# Patient Record
Sex: Female | Born: 1945 | Race: Black or African American | Hispanic: No | Marital: Married | Smoking: Current every day smoker
Health system: Southern US, Community
[De-identification: ages and names within clinical notes are randomized; demographics above are authoritative.]

## PROBLEM LIST (undated history)

## (undated) HISTORY — PX: ABDOMINAL HYSTERECTOMY: SHX81

---

## 2003-08-11 ENCOUNTER — Ambulatory Visit (HOSPITAL_COMMUNITY): Admission: RE | Admit: 2003-08-11 | Discharge: 2003-08-12 | Payer: Self-pay | Admitting: Neurosurgery

## 2005-06-19 ENCOUNTER — Ambulatory Visit: Payer: Self-pay | Admitting: General Practice

## 2013-02-16 ENCOUNTER — Ambulatory Visit: Payer: Self-pay | Admitting: Family Medicine

## 2013-04-02 ENCOUNTER — Ambulatory Visit: Payer: Self-pay | Admitting: Family Medicine

## 2016-04-03 ENCOUNTER — Other Ambulatory Visit: Payer: Self-pay | Admitting: Family Medicine

## 2016-04-27 ENCOUNTER — Other Ambulatory Visit: Payer: Self-pay | Admitting: Family Medicine

## 2016-04-27 DIAGNOSIS — Z1239 Encounter for other screening for malignant neoplasm of breast: Secondary | ICD-10-CM

## 2016-07-12 ENCOUNTER — Ambulatory Visit
Admission: RE | Admit: 2016-07-12 | Discharge: 2016-07-12 | Disposition: A | Discharge status: Home / Self Care | Payer: Medicare Other | Source: Ambulatory Visit | Attending: Family Medicine | Admitting: Family Medicine

## 2016-07-12 ENCOUNTER — Encounter: Payer: Self-pay | Admitting: Radiology

## 2016-07-12 DIAGNOSIS — Z1239 Encounter for other screening for malignant neoplasm of breast: Secondary | ICD-10-CM

## 2016-07-12 DIAGNOSIS — Z1231 Encounter for screening mammogram for malignant neoplasm of breast: Secondary | ICD-10-CM | POA: Insufficient documentation

## 2017-10-23 ENCOUNTER — Other Ambulatory Visit: Payer: Self-pay | Admitting: Physician Assistant

## 2017-10-23 DIAGNOSIS — Z1239 Encounter for other screening for malignant neoplasm of breast: Secondary | ICD-10-CM

## 2017-11-06 ENCOUNTER — Ambulatory Visit
Admission: RE | Admit: 2017-11-06 | Discharge: 2017-11-06 | Disposition: A | Discharge status: Home / Self Care | Payer: Medicare Other | Source: Ambulatory Visit | Attending: Physician Assistant | Admitting: Physician Assistant

## 2017-11-06 DIAGNOSIS — Z1231 Encounter for screening mammogram for malignant neoplasm of breast: Secondary | ICD-10-CM | POA: Diagnosis present

## 2017-11-06 DIAGNOSIS — Z1239 Encounter for other screening for malignant neoplasm of breast: Secondary | ICD-10-CM

## 2017-11-18 ENCOUNTER — Telehealth: Payer: Self-pay | Admitting: *Deleted

## 2017-11-18 DIAGNOSIS — Z87891 Personal history of nicotine dependence: Secondary | ICD-10-CM

## 2017-11-18 DIAGNOSIS — Z122 Encounter for screening for malignant neoplasm of respiratory organs: Secondary | ICD-10-CM

## 2017-11-18 NOTE — Telephone Encounter (Signed)
Received referral for initial lung cancer screening scan. Contacted patient and obtained smoking history,(current, 30.75 pack year) as well as answering questions related to screening process. Patient denies signs of lung cancer such as weight loss or hemoptysis. Patient denies comorbidity that would prevent curative treatment if lung cancer were found. Patient is scheduled for shared decision making visit and CT scan on 12/05/17.

## 2017-12-04 ENCOUNTER — Encounter: Payer: Self-pay | Admitting: Nurse Practitioner

## 2017-12-05 ENCOUNTER — Ambulatory Visit
Admission: RE | Admit: 2017-12-05 | Discharge: 2017-12-05 | Disposition: A | Discharge status: Home / Self Care | Payer: Medicare Other | Source: Ambulatory Visit | Attending: Nurse Practitioner | Admitting: Nurse Practitioner

## 2017-12-05 ENCOUNTER — Inpatient Hospital Stay: Payer: Medicare Other | Attending: Nurse Practitioner | Admitting: Nurse Practitioner

## 2017-12-05 DIAGNOSIS — Z122 Encounter for screening for malignant neoplasm of respiratory organs: Secondary | ICD-10-CM

## 2017-12-05 DIAGNOSIS — J432 Centrilobular emphysema: Secondary | ICD-10-CM | POA: Diagnosis not present

## 2017-12-05 DIAGNOSIS — I700 Atherosclerosis of aorta: Secondary | ICD-10-CM | POA: Insufficient documentation

## 2017-12-05 DIAGNOSIS — Z87891 Personal history of nicotine dependence: Secondary | ICD-10-CM | POA: Diagnosis not present

## 2017-12-05 DIAGNOSIS — N200 Calculus of kidney: Secondary | ICD-10-CM | POA: Diagnosis not present

## 2017-12-05 DIAGNOSIS — I517 Cardiomegaly: Secondary | ICD-10-CM | POA: Diagnosis not present

## 2017-12-05 DIAGNOSIS — R911 Solitary pulmonary nodule: Secondary | ICD-10-CM | POA: Insufficient documentation

## 2017-12-05 NOTE — Progress Notes (Signed)
In accordance with CMS guidelines, patient has met eligibility criteria including age, absence of signs or symptoms of lung cancer.  Social History   Tobacco Use  . Smoking status: Current Every Day Smoker    Packs/day: 0.75    Years: 41.00    Pack years: 30.75    Types: Cigarettes  Substance Use Topics  . Alcohol use: Not on file  . Drug use: Not on file      A shared decision-making session was conducted prior to the performance of CT scan. This includes one or more decision aids, includes benefits and harms of screening, follow-up diagnostic testing, over-diagnosis, false positive rate, and total radiation exposure.   Counseling on the importance of adherence to annual lung cancer LDCT screening, impact of co-morbidities, and ability or willingness to undergo diagnosis and treatment is imperative for compliance of the program.   Counseling on the importance of continued smoking cessation for former smokers; the importance of smoking cessation for current smokers, and information about tobacco cessation interventions have been given to patient including [place] and [facility] quit [place] programs.   Written order for lung cancer screening with LDCT has been given to the patient and any and all questions have been answered to the best of my abilities.    Yearly follow up will be coordinated by Burgess Estelle, Thoracic Navigator.  Beckey Rutter, DNP, AGNP-C [place] [facility] [phone] (work cell) [phone] (office) 12/05/17 4:04 PM

## 2017-12-06 ENCOUNTER — Telehealth: Payer: Self-pay | Admitting: *Deleted

## 2017-12-06 NOTE — Telephone Encounter (Signed)
Notified patient of LDCT lung cancer screening program results with recommendation for 3 month follow up imaging. Also notified of incidental findings noted below and is encouraged to discuss further with PCP who will receive a copy of this note and/or the CT report. Patient verbalizes understanding.   IMPRESSION: 1. Lung-RADS 4A, suspicious. Dominant part solid 11.4 mm posterior left upper lobe pulmonary nodule. Follow up low-dose chest CT without contrast in 3 months (please use the following order, "CT CHEST LCS NODULE FOLLOW-UP W/O CM") is recommended. 2. Mild cardiomegaly. 3. Nonobstructing upper left nephrolithiasis.  Aortic Atherosclerosis (ICD10-I70.0) and Emphysema ([id number]

## 2018-02-19 ENCOUNTER — Other Ambulatory Visit: Payer: Self-pay | Admitting: Family Medicine

## 2018-02-19 DIAGNOSIS — R911 Solitary pulmonary nodule: Secondary | ICD-10-CM

## 2018-02-19 DIAGNOSIS — F17200 Nicotine dependence, unspecified, uncomplicated: Secondary | ICD-10-CM

## 2018-03-03 ENCOUNTER — Telehealth: Payer: Self-pay | Admitting: *Deleted

## 2018-03-03 NOTE — Telephone Encounter (Signed)
Attempted to contact patient r/t CT Screening due at this time.  No answer and unable to leave message at this time.      

## 2018-03-04 ENCOUNTER — Telehealth: Payer: Self-pay | Admitting: *Deleted

## 2018-03-04 ENCOUNTER — Encounter: Payer: Self-pay | Admitting: *Deleted

## 2018-03-04 NOTE — Telephone Encounter (Signed)
Attempted to contact patient r/t CT Screening due at this time.  No answer and unable to leave message at this time.      

## 2018-03-11 ENCOUNTER — Telehealth: Payer: Self-pay | Admitting: *Deleted

## 2018-03-11 DIAGNOSIS — Z87891 Personal history of nicotine dependence: Secondary | ICD-10-CM

## 2018-03-11 DIAGNOSIS — R918 Other nonspecific abnormal finding of lung field: Secondary | ICD-10-CM

## 2018-03-11 DIAGNOSIS — Z122 Encounter for screening for malignant neoplasm of respiratory organs: Secondary | ICD-10-CM

## 2018-03-11 NOTE — Telephone Encounter (Signed)
Patient returned messages regarding follow up lung CT scan. She is agreeable for scheduling.

## 2018-03-14 ENCOUNTER — Ambulatory Visit
Admission: RE | Admit: 2018-03-14 | Discharge: 2018-03-14 | Disposition: A | Discharge status: Home / Self Care | Payer: Medicare Other | Source: Ambulatory Visit | Attending: Nurse Practitioner | Admitting: Nurse Practitioner

## 2018-03-14 ENCOUNTER — Telehealth: Payer: Self-pay | Admitting: *Deleted

## 2018-03-14 ENCOUNTER — Encounter: Payer: Self-pay | Admitting: *Deleted

## 2018-03-14 DIAGNOSIS — N200 Calculus of kidney: Secondary | ICD-10-CM | POA: Diagnosis not present

## 2018-03-14 DIAGNOSIS — I700 Atherosclerosis of aorta: Secondary | ICD-10-CM | POA: Diagnosis not present

## 2018-03-14 DIAGNOSIS — R918 Other nonspecific abnormal finding of lung field: Secondary | ICD-10-CM | POA: Diagnosis not present

## 2018-03-14 DIAGNOSIS — J432 Centrilobular emphysema: Secondary | ICD-10-CM | POA: Insufficient documentation

## 2018-03-14 DIAGNOSIS — I2510 Atherosclerotic heart disease of native coronary artery without angina pectoris: Secondary | ICD-10-CM | POA: Diagnosis not present

## 2018-03-14 DIAGNOSIS — F17210 Nicotine dependence, cigarettes, uncomplicated: Secondary | ICD-10-CM | POA: Diagnosis not present

## 2018-03-14 DIAGNOSIS — Z122 Encounter for screening for malignant neoplasm of respiratory organs: Secondary | ICD-10-CM | POA: Insufficient documentation

## 2018-03-14 DIAGNOSIS — Z87891 Personal history of nicotine dependence: Secondary | ICD-10-CM

## 2018-03-14 NOTE — Telephone Encounter (Signed)
Notified patient of LDCT lung cancer screening program results with recommendation for 12 month follow up imaging.  Also notified of incidental findings noted below and is encouraged to discuss further questions with PCP who will receive a copy of this not and/or the CT reports.  Patient verbalized understanding.    IMPRESSION: 1. Lung-RADS 2, benign appearance or behavior. Continue annual screening with low-dose chest CT without contrast in 12 months. Similar appearance of bilateral pulmonary nodules, including the mixed attenuation left upper lobe nodule. 2. Underlying pattern of upper lobe predominant ground-glass nodularity likely represents respiratory bronchiolitis. 3. Aortic atherosclerosis (ICD10-I70.0), coronary artery atherosclerosis and emphysema (ICD10-J43.9). 4. Left nephrolithiasis.  Patient informed about the findings of left nephrolitiasis and informed if she was having problems with this to call her PCP with any issues or questions.  Voiced understanding.

## 2018-03-18 ENCOUNTER — Encounter: Payer: Self-pay | Admitting: *Deleted

## 2018-03-18 ENCOUNTER — Telehealth: Payer: Self-pay | Admitting: *Deleted

## 2018-03-18 NOTE — Telephone Encounter (Signed)
Called and discussed results with patient.  Including results of bronchiolitis and left nephrolithiasis.  Encouraged pt if she was having any symptoms or questions to notify PCP and that we would follow up in 1 year with the LDCT scan, voiced understanding.    IMPRESSION: 1. Lung-RADS 2, benign appearance or behavior. Continue annual screening with low-dose chest CT without contrast in 12 months. Similar appearance of bilateral pulmonary nodules, including the mixed attenuation left upper lobe nodule. 2. Underlying pattern of upper lobe predominant ground-glass nodularity likely represents respiratory bronchiolitis. 3. Aortic atherosclerosis (ICD10-I70.0), coronary artery atherosclerosis and emphysema (ICD10-J43.9). 4. Left nephrolithiasis.

## 2018-08-13 IMAGING — CT CT CHEST LUNG CANCER SCREENING LOW DOSE W/O CM
2 of 5 series · 15 of 40 positions shown, 18 images · non-contrast
Comparison: None.

CLINICAL DATA: 72-year-old asymptomatic female current smoker with
30.75 pack-year smoking history.

EXAM:
CT CHEST WITHOUT CONTRAST LOW-DOSE FOR LUNG CANCER SCREENING
TECHNIQUE: Multidetector CT imaging of the chest was performed following the
standard protocol without IV contrast.

[Series 3: lung · axial · 0.56mm/px · z∈[-1131,-856]mm · 12 of 307 slices shown, 15 images (1 of 2)]
[im 16/307  mediastinal]
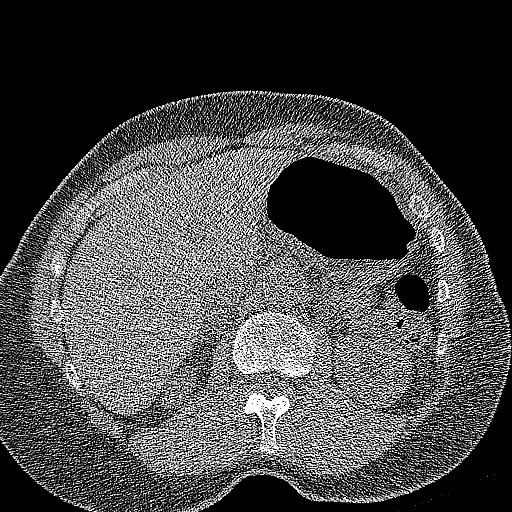
[im 16/307  lung]
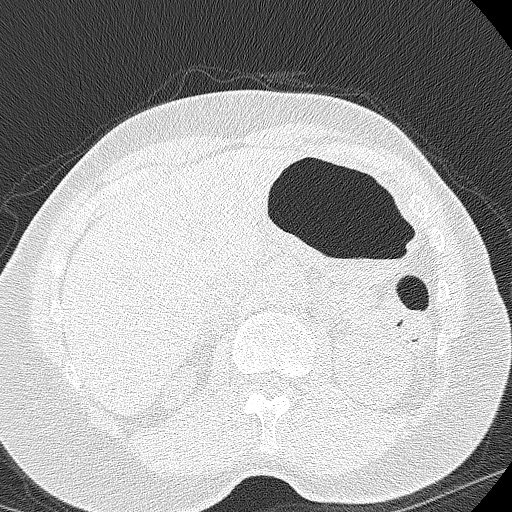
[im 46/307  lung]
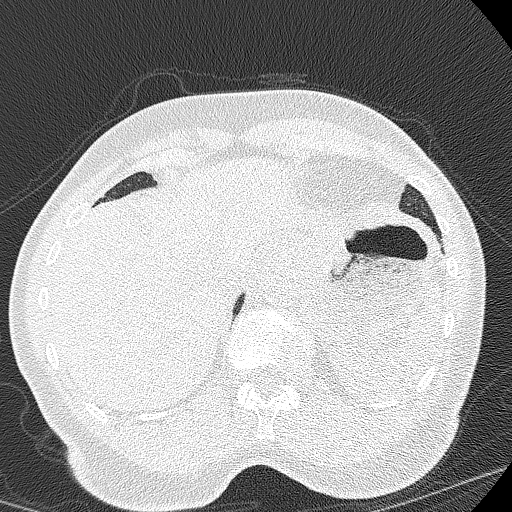
[im 62/307  lung]
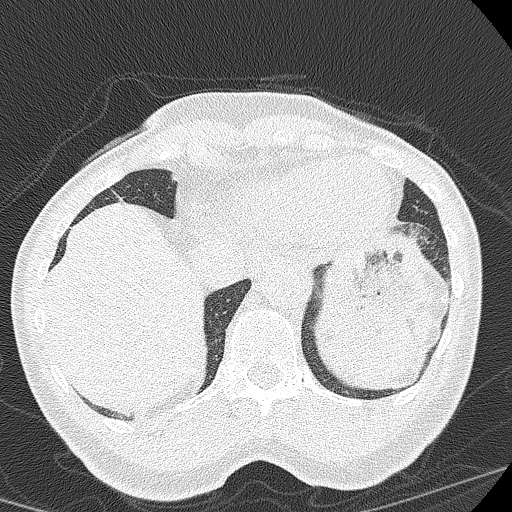
[im 92/307  lung]
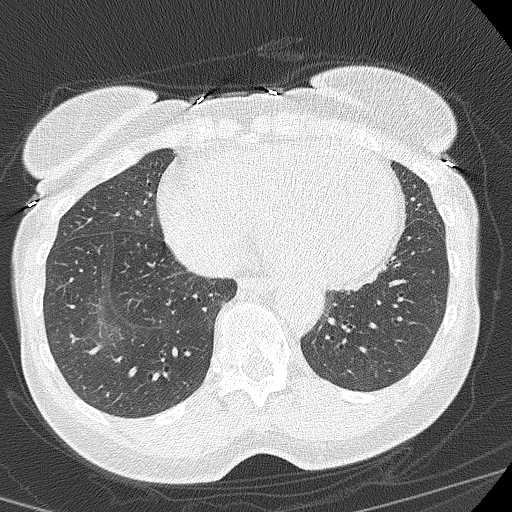
[im 123/307  mediastinal]
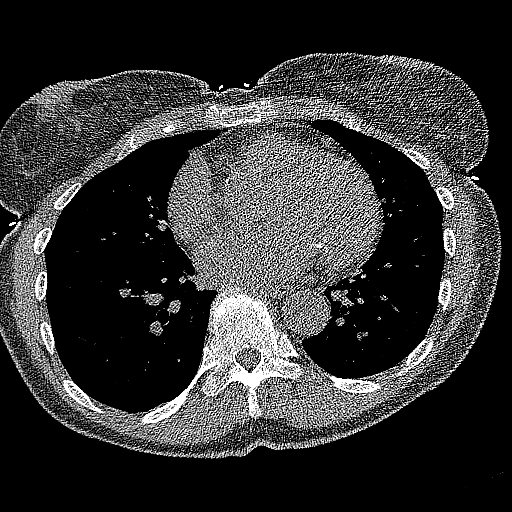
[im 123/307  lung]
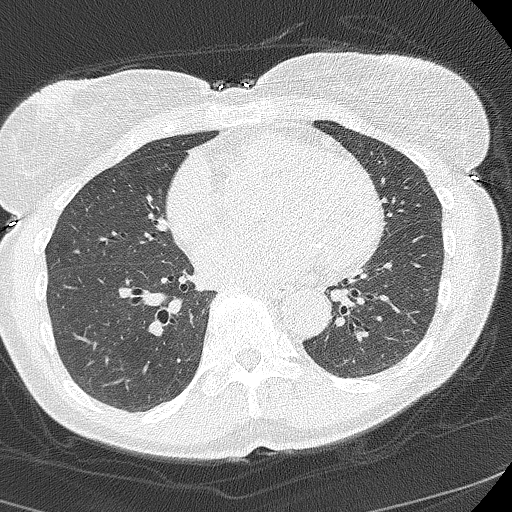
[im 138/307  lung]
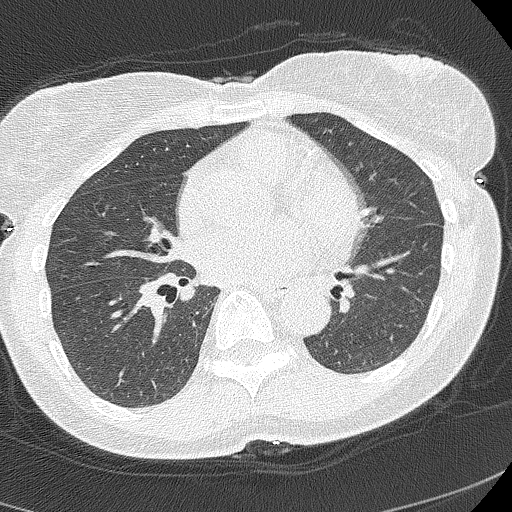
[im 169/307  lung]
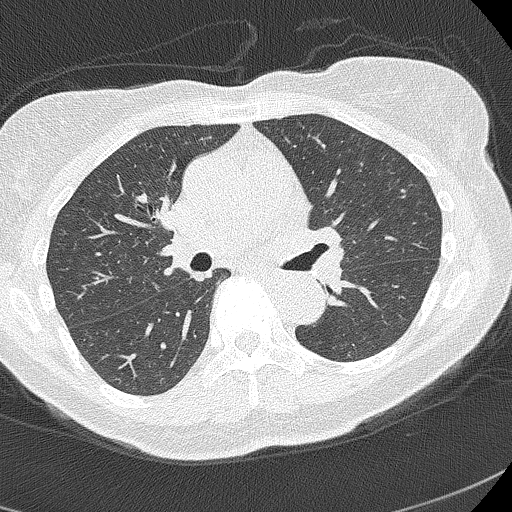
[im 184/307  lung]
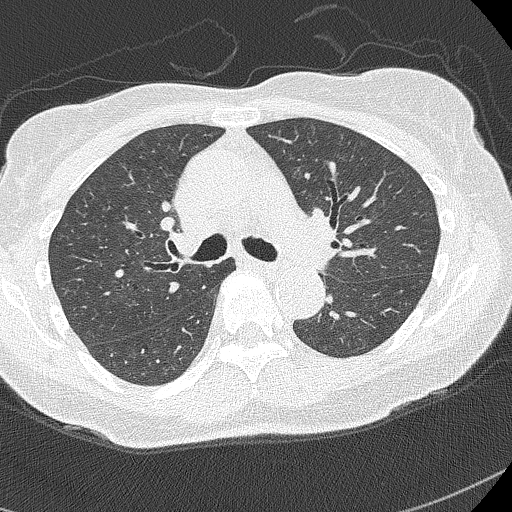
[im 215/307  mediastinal]
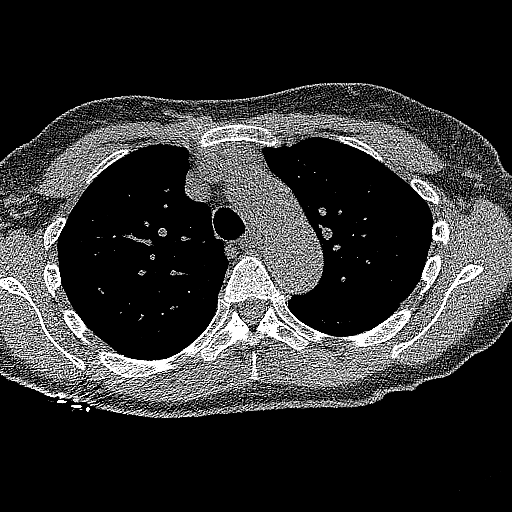
[im 215/307  lung]
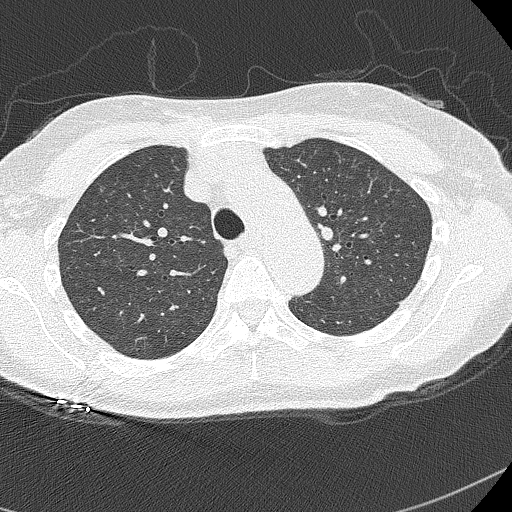
[im 245/307  lung]
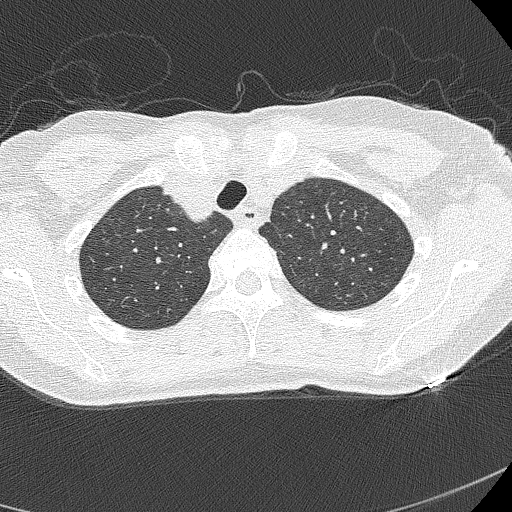
[im 261/307  lung]
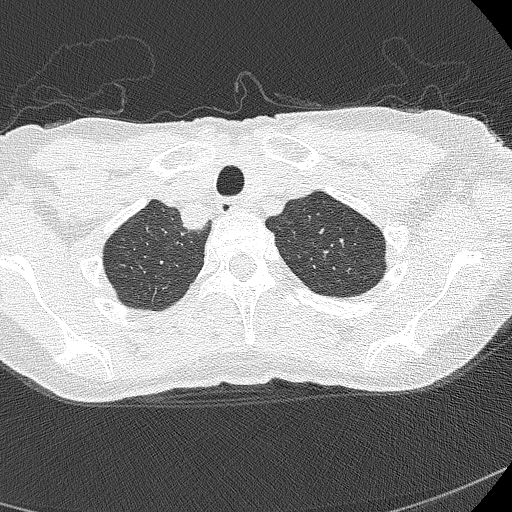
[im 291/307  lung]
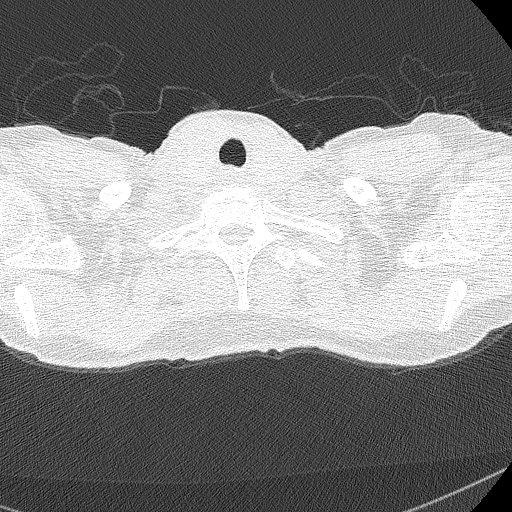

[Series 4: lung · coronal · 0.56mm/px · 3 of 230 slices shown (2 of 2)]
[im 46/230  lung]
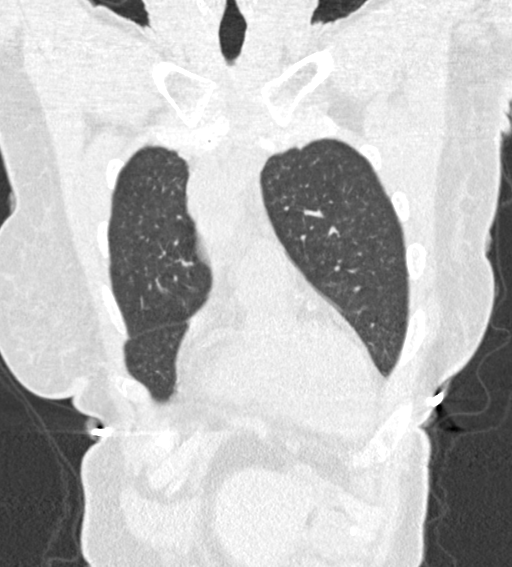
[im 92/230  lung]
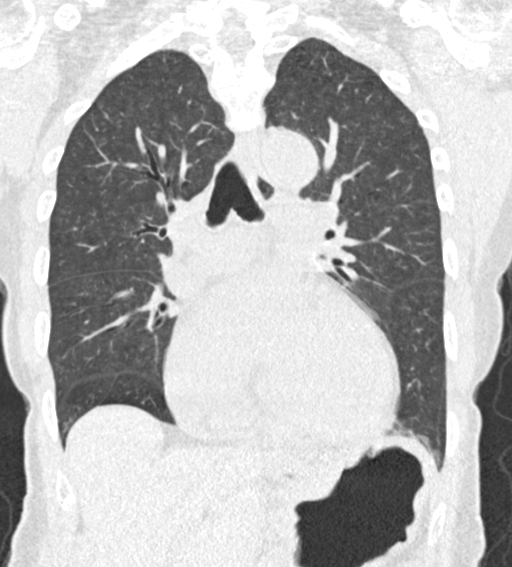
[im 138/230  lung]
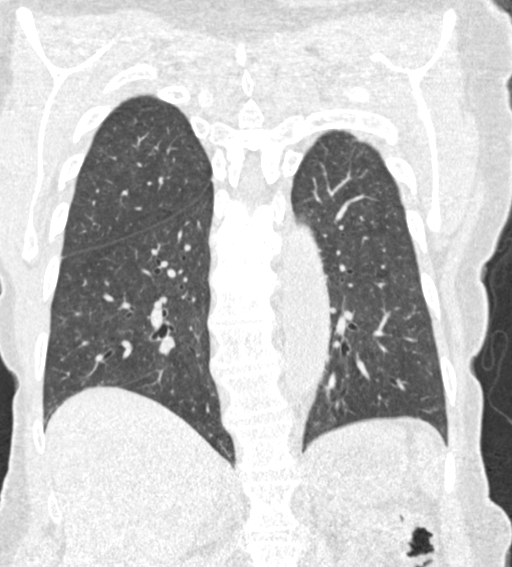

[15 of 40 positions shown; findings below may reference images not displayed]

FINDINGS: Cardiovascular: Mild cardiomegaly. No significant pericardial
effusion/thickening. Atherosclerotic nonaneurysmal thoracic aorta.
Normal caliber pulmonary arteries.

Mediastinum/Nodes: No discrete thyroid nodules. Unremarkable
esophagus. No pathologically enlarged axillary, mediastinal or hilar
lymph nodes, noting limited sensitivity for the detection of hilar
adenopathy on this noncontrast study.

Lungs/Pleura: No pneumothorax. No pleural effusion. Mild
centrilobular emphysema. No acute consolidative airspace disease or
lung masses. There are several pulmonary nodules scattered
throughout both lungs, with a dominant part solid posterior left
upper lobe pulmonary nodule measuring 11.4 mm in total volume
derived mean diameter with a 6.0 mm solid component (series 3/image
102).

Upper abdomen: Nonobstructing 2 mm upper left renal stone.

Musculoskeletal: No aggressive appearing focal osseous lesions.
Marked thoracic spondylosis.
IMPRESSION: 1. Lung-RADS 4A, suspicious. Dominant part solid 11.4 mm posterior
left upper lobe pulmonary nodule. Follow up low-dose chest CT
without contrast in 3 months (please use the following order, "CT
CHEST LCS NODULE FOLLOW-UP W/O CM") is recommended.
2. Mild cardiomegaly.
3. Nonobstructing upper left nephrolithiasis.

Aortic Atherosclerosis (9D5XA-CT7.7) and Emphysema (9D5XA-K7D.8).

These results will be called to the ordering clinician or
representative by the Radiologist Assistant, and communication
documented in the PACS or zVision Dashboard.

## 2018-09-02 ENCOUNTER — Ambulatory Visit
Admission: RE | Admit: 2018-09-02 | Discharge: 2018-09-02 | Disposition: A | Discharge status: Home / Self Care | Payer: Medicare Other | Attending: Family Medicine | Admitting: Family Medicine

## 2018-09-02 ENCOUNTER — Ambulatory Visit
Admission: RE | Admit: 2018-09-02 | Discharge: 2018-09-02 | Disposition: A | Discharge status: Home / Self Care | Payer: Medicare Other | Source: Ambulatory Visit | Attending: Family Medicine | Admitting: Family Medicine

## 2018-09-02 ENCOUNTER — Other Ambulatory Visit: Payer: Self-pay | Admitting: Family Medicine

## 2018-09-02 DIAGNOSIS — M25551 Pain in right hip: Secondary | ICD-10-CM

## 2019-04-02 ENCOUNTER — Telehealth: Payer: Self-pay | Admitting: *Deleted

## 2019-04-02 DIAGNOSIS — Z122 Encounter for screening for malignant neoplasm of respiratory organs: Secondary | ICD-10-CM

## 2019-04-02 DIAGNOSIS — Z87891 Personal history of nicotine dependence: Secondary | ICD-10-CM

## 2019-04-02 NOTE — Telephone Encounter (Signed)
Patient has been notified that annual lung cancer screening low dose CT scan is due currently or will be in near future. Confirmed that patient is within the age range of 55-77, and asymptomatic, (no signs or symptoms of lung cancer). Patient denies illness that would prevent curative treatment for lung cancer if found. Verified smoking history, (current, 31.5 pack year). The shared decision making visit was done 12/05/17. Patient is agreeable for CT scan being scheduled.

## 2019-04-07 ENCOUNTER — Other Ambulatory Visit: Payer: Self-pay

## 2019-04-07 ENCOUNTER — Ambulatory Visit
Admission: RE | Admit: 2019-04-07 | Discharge: 2019-04-07 | Disposition: A | Discharge status: Home / Self Care | Payer: Medicare Other | Source: Ambulatory Visit | Attending: Oncology | Admitting: Oncology

## 2019-04-07 DIAGNOSIS — Z87891 Personal history of nicotine dependence: Secondary | ICD-10-CM | POA: Insufficient documentation

## 2019-04-07 DIAGNOSIS — Z122 Encounter for screening for malignant neoplasm of respiratory organs: Secondary | ICD-10-CM

## 2019-04-09 ENCOUNTER — Encounter: Payer: Self-pay | Admitting: *Deleted

## 2019-05-11 IMAGING — CR DG HIP (WITH OR WITHOUT PELVIS) 2-3V*R*
2 series · 2 of 2 positions shown · non-contrast
Comparison: None.

CLINICAL DATA: Right hip pain for the past 6 months.  No injury.

EXAM:
DG HIP (WITH OR WITHOUT PELVIS) 2-3V RIGHT

[hip ap]
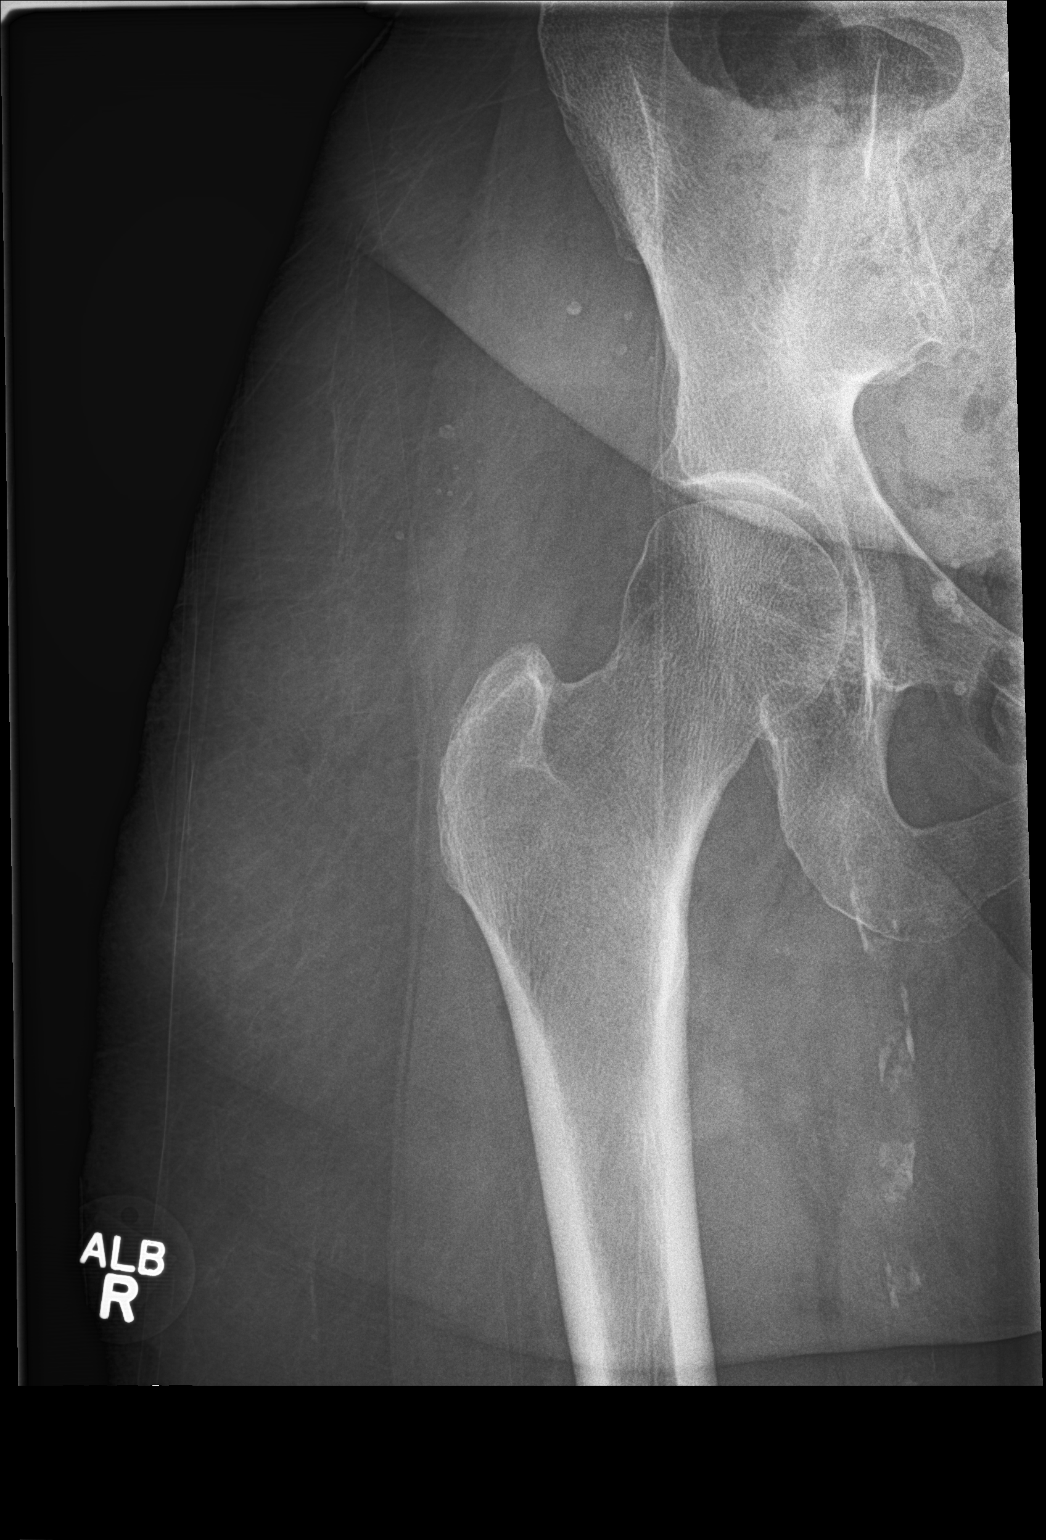

[hip lat]
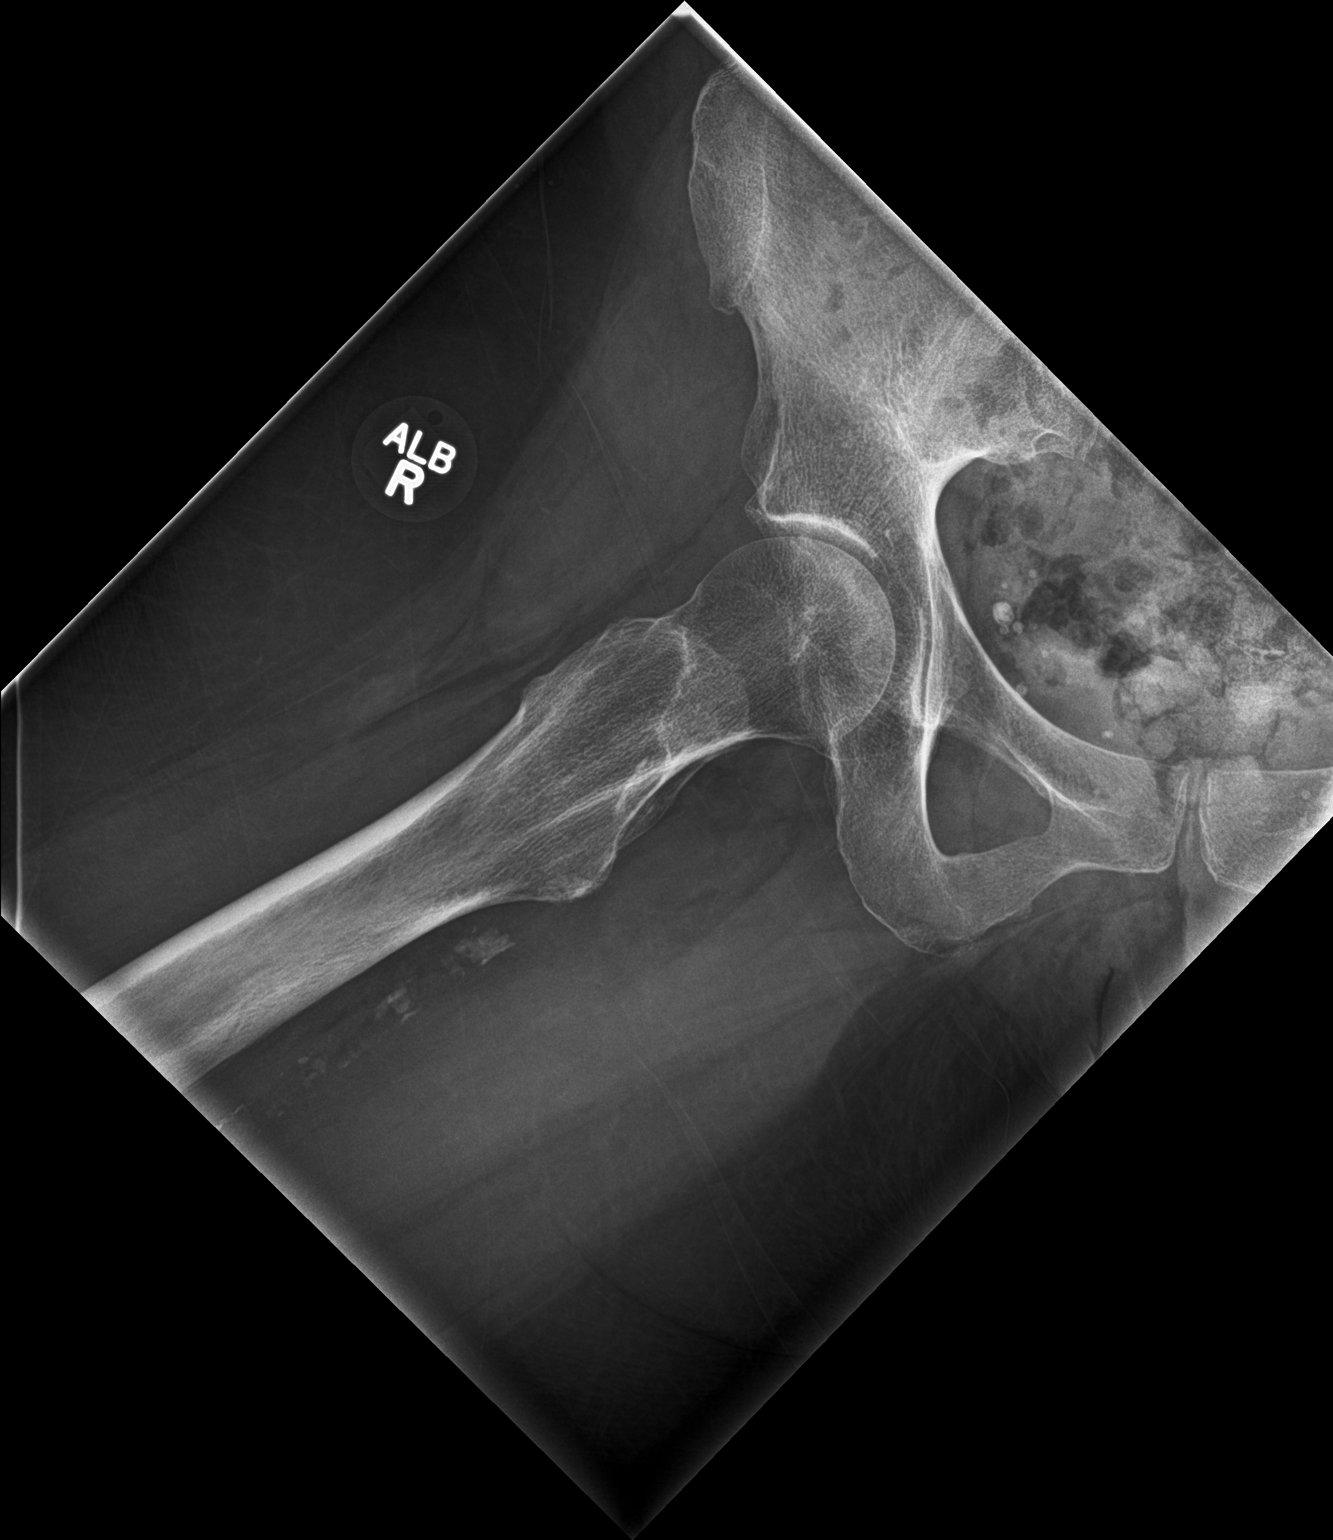

[2 of 2 positions shown; findings below may reference images not displayed]

FINDINGS: There is no evidence of hip fracture or dislocation. There is no
evidence of arthropathy or other focal bone abnormality. Osteopenia.
Vascular calcifications.
IMPRESSION: Negative.

## 2019-09-10 ENCOUNTER — Other Ambulatory Visit: Payer: Self-pay | Admitting: Family Medicine

## 2019-09-10 DIAGNOSIS — Z1231 Encounter for screening mammogram for malignant neoplasm of breast: Secondary | ICD-10-CM

## 2019-09-10 DIAGNOSIS — M8580 Other specified disorders of bone density and structure, unspecified site: Secondary | ICD-10-CM

## 2019-11-23 ENCOUNTER — Ambulatory Visit
Admission: RE | Admit: 2019-11-23 | Discharge: 2019-11-23 | Disposition: A | Discharge status: Home / Self Care | Payer: Medicare Other | Source: Ambulatory Visit | Attending: Family Medicine | Admitting: Family Medicine

## 2019-11-23 ENCOUNTER — Other Ambulatory Visit: Payer: Self-pay

## 2019-11-23 DIAGNOSIS — Z1231 Encounter for screening mammogram for malignant neoplasm of breast: Secondary | ICD-10-CM | POA: Diagnosis not present

## 2019-11-23 DIAGNOSIS — M8580 Other specified disorders of bone density and structure, unspecified site: Secondary | ICD-10-CM

## 2019-11-23 DIAGNOSIS — M810 Age-related osteoporosis without current pathological fracture: Secondary | ICD-10-CM | POA: Insufficient documentation

## 2020-03-09 ENCOUNTER — Telehealth: Payer: Self-pay | Admitting: *Deleted

## 2020-03-09 NOTE — Telephone Encounter (Signed)
(  03/09/2020) Unable to leave message for pt to notify them that it is time to schedule annual low dose lung cancer screening CT scan. Will call back to verify information prior to the scan being scheduled SRW     

## 2020-03-15 ENCOUNTER — Telehealth: Payer: Self-pay | Admitting: *Deleted

## 2020-03-15 NOTE — Telephone Encounter (Signed)
(  03/15/2020) Unable to leave message for pt to notify them that it is time to schedule annual low dose lung cancer screening CT scan. Will call back to verify information prior to the scan being scheduled SRW     

## 2020-03-26 ENCOUNTER — Telehealth: Payer: Self-pay | Admitting: *Deleted

## 2020-03-26 NOTE — Telephone Encounter (Signed)
Attempted to leave a voicemail to inform the patient that her lung cancer screening scan is due soon. Unable to leave a voicemail because patient does not have it set up. Will continue to try to reach her.

## 2020-04-12 ENCOUNTER — Encounter: Payer: Self-pay | Admitting: *Deleted

## 2020-04-22 ENCOUNTER — Encounter: Payer: Self-pay | Admitting: *Deleted

## 2020-05-30 ENCOUNTER — Other Ambulatory Visit: Payer: Self-pay | Admitting: Family Medicine

## 2020-05-30 ENCOUNTER — Other Ambulatory Visit (HOSPITAL_COMMUNITY): Payer: Self-pay | Admitting: Family Medicine

## 2020-06-07 ENCOUNTER — Other Ambulatory Visit: Payer: Self-pay | Admitting: Family Medicine

## 2020-06-07 DIAGNOSIS — F17200 Nicotine dependence, unspecified, uncomplicated: Secondary | ICD-10-CM

## 2020-06-07 DIAGNOSIS — R911 Solitary pulmonary nodule: Secondary | ICD-10-CM

## 2020-06-16 ENCOUNTER — Ambulatory Visit
Admission: RE | Admit: 2020-06-16 | Discharge: 2020-06-16 | Disposition: A | Discharge status: Home / Self Care | Payer: Medicare Other | Source: Ambulatory Visit | Attending: Nurse Practitioner | Admitting: Nurse Practitioner

## 2020-06-16 ENCOUNTER — Ambulatory Visit
Admission: RE | Admit: 2020-06-16 | Discharge: 2020-06-16 | Disposition: A | Discharge status: Home / Self Care | Payer: Medicare Other | Source: Ambulatory Visit | Attending: Family Medicine | Admitting: Family Medicine

## 2020-06-16 ENCOUNTER — Other Ambulatory Visit: Payer: Self-pay

## 2020-06-16 ENCOUNTER — Other Ambulatory Visit: Payer: Self-pay | Admitting: *Deleted

## 2020-06-16 DIAGNOSIS — Z87891 Personal history of nicotine dependence: Secondary | ICD-10-CM | POA: Diagnosis not present

## 2020-06-16 DIAGNOSIS — Z122 Encounter for screening for malignant neoplasm of respiratory organs: Secondary | ICD-10-CM

## 2020-06-16 DIAGNOSIS — R911 Solitary pulmonary nodule: Secondary | ICD-10-CM | POA: Diagnosis present

## 2020-06-16 DIAGNOSIS — F17200 Nicotine dependence, unspecified, uncomplicated: Secondary | ICD-10-CM | POA: Insufficient documentation

## 2020-06-16 NOTE — Progress Notes (Signed)
Patient arrived for lung screening scan. Okay per Trula Ore in Business office, patient is a current smoker, with a 32.25 pack year history.

## 2020-06-21 ENCOUNTER — Encounter: Payer: Self-pay | Admitting: *Deleted

## 2020-11-02 ENCOUNTER — Other Ambulatory Visit: Payer: Self-pay | Admitting: Family Medicine

## 2020-11-02 DIAGNOSIS — Z1231 Encounter for screening mammogram for malignant neoplasm of breast: Secondary | ICD-10-CM

## 2020-11-23 ENCOUNTER — Other Ambulatory Visit: Payer: Self-pay

## 2020-11-23 ENCOUNTER — Ambulatory Visit
Admission: RE | Admit: 2020-11-23 | Discharge: 2020-11-23 | Disposition: A | Discharge status: Home / Self Care | Payer: Medicare Other | Source: Ambulatory Visit | Attending: Family Medicine | Admitting: Family Medicine

## 2020-11-23 DIAGNOSIS — Z1231 Encounter for screening mammogram for malignant neoplasm of breast: Secondary | ICD-10-CM | POA: Insufficient documentation

## 2021-09-20 ENCOUNTER — Ambulatory Visit
Admission: RE | Admit: 2021-09-20 | Discharge: 2021-09-20 | Disposition: A | Discharge status: Home / Self Care | Payer: Medicare Other | Source: Ambulatory Visit | Attending: Family Medicine | Admitting: Family Medicine

## 2021-09-20 ENCOUNTER — Ambulatory Visit
Admission: RE | Admit: 2021-09-20 | Discharge: 2021-09-20 | Disposition: A | Discharge status: Home / Self Care | Payer: Medicare Other | Attending: Family Medicine | Admitting: Family Medicine

## 2021-09-20 ENCOUNTER — Other Ambulatory Visit: Payer: Self-pay | Admitting: Family Medicine

## 2021-09-20 DIAGNOSIS — M545 Low back pain, unspecified: Secondary | ICD-10-CM

## 2021-10-06 ENCOUNTER — Telehealth: Payer: Self-pay | Admitting: *Deleted

## 2021-10-06 NOTE — Telephone Encounter (Signed)
Left message for pt to call to schedule f/u lung screening CT scan.  °

## 2021-10-17 ENCOUNTER — Other Ambulatory Visit: Payer: Self-pay

## 2021-10-17 DIAGNOSIS — Z87891 Personal history of nicotine dependence: Secondary | ICD-10-CM

## 2021-10-17 DIAGNOSIS — F17210 Nicotine dependence, cigarettes, uncomplicated: Secondary | ICD-10-CM

## 2021-10-25 ENCOUNTER — Other Ambulatory Visit: Payer: Self-pay | Admitting: Family Medicine

## 2021-10-25 DIAGNOSIS — Z1231 Encounter for screening mammogram for malignant neoplasm of breast: Secondary | ICD-10-CM

## 2021-10-27 ENCOUNTER — Ambulatory Visit
Admission: RE | Admit: 2021-10-27 | Discharge: 2021-10-27 | Disposition: A | Discharge status: Home / Self Care | Payer: Medicare Other | Source: Ambulatory Visit | Attending: Family Medicine | Admitting: Family Medicine

## 2021-10-27 DIAGNOSIS — F17210 Nicotine dependence, cigarettes, uncomplicated: Secondary | ICD-10-CM | POA: Insufficient documentation

## 2021-10-27 DIAGNOSIS — Z87891 Personal history of nicotine dependence: Secondary | ICD-10-CM | POA: Diagnosis present

## 2021-10-30 ENCOUNTER — Other Ambulatory Visit: Payer: Self-pay | Admitting: Acute Care

## 2021-10-30 DIAGNOSIS — Z87891 Personal history of nicotine dependence: Secondary | ICD-10-CM

## 2021-10-30 DIAGNOSIS — F17210 Nicotine dependence, cigarettes, uncomplicated: Secondary | ICD-10-CM

## 2021-11-22 ENCOUNTER — Other Ambulatory Visit: Payer: Self-pay | Admitting: Family Medicine

## 2021-11-22 DIAGNOSIS — M5459 Other low back pain: Secondary | ICD-10-CM

## 2021-12-06 ENCOUNTER — Ambulatory Visit
Admission: RE | Admit: 2021-12-06 | Discharge: 2021-12-06 | Disposition: A | Discharge status: Home / Self Care | Payer: Medicare Other | Source: Ambulatory Visit | Attending: Family Medicine | Admitting: Family Medicine

## 2021-12-06 DIAGNOSIS — M5459 Other low back pain: Secondary | ICD-10-CM | POA: Diagnosis present

## 2021-12-07 ENCOUNTER — Ambulatory Visit
Admission: RE | Admit: 2021-12-07 | Discharge: 2021-12-07 | Disposition: A | Discharge status: Home / Self Care | Payer: Medicare Other | Source: Ambulatory Visit | Attending: Family Medicine | Admitting: Family Medicine

## 2021-12-07 DIAGNOSIS — Z1231 Encounter for screening mammogram for malignant neoplasm of breast: Secondary | ICD-10-CM | POA: Insufficient documentation

## 2022-01-23 ENCOUNTER — Ambulatory Visit: Payer: Medicare Other | Attending: Family Medicine | Admitting: Physical Therapy

## 2022-01-23 ENCOUNTER — Encounter: Payer: Self-pay | Admitting: Physical Therapy

## 2022-01-23 DIAGNOSIS — M5459 Other low back pain: Secondary | ICD-10-CM | POA: Diagnosis not present

## 2022-01-23 DIAGNOSIS — M5416 Radiculopathy, lumbar region: Secondary | ICD-10-CM | POA: Diagnosis present

## 2022-01-23 DIAGNOSIS — M6281 Muscle weakness (generalized): Secondary | ICD-10-CM | POA: Diagnosis not present

## 2022-01-23 DIAGNOSIS — M4126 Other idiopathic scoliosis, lumbar region: Secondary | ICD-10-CM

## 2022-01-23 DIAGNOSIS — M2560 Stiffness of unspecified joint, not elsewhere classified: Secondary | ICD-10-CM | POA: Diagnosis not present

## 2022-01-23 NOTE — Therapy (Addendum)
OUTPATIENT PHYSICAL THERAPY THORACOLUMBAR EVALUATION   Patient Name: Betty Cunningham MRN: [id number] DOB:[birth year], 76 y.o., female Today's Date: 01/24/2022   PT End of Session - 01/23/22 1738     Visit Number 1    Number of Visits 10    Date for PT Re-Evaluation 03/20/22    PT Start Time 0943    PT Stop Time 1038    PT Time Calculation (min) 55 min    Activity Tolerance Patient tolerated treatment well    Behavior During Therapy [facility] for tasks assessed/performed             History reviewed. No pertinent past medical history. Past Surgical History:  Procedure Laterality Date   ABDOMINAL HYSTERECTOMY     There are no problems to display for this patient.   PCP: Guy Begin, MD  REFERRING PROVIDER: Guy Begin, MD  REFERRING DIAG: Radiculopathy and LBP  Rationale for Evaluation and Treatment Rehabilitation  THERAPY DIAG:  Other low back pain  Radiculopathy, lumbar region  Muscle weakness (generalized)  Joint stiffness  ONSET DATE: 3 yrs ago   SUBJECTIVE:                                                                                                                                                                                           SUBJECTIVE STATEMENT: Pt has experienced LBP for the past three years, which has slowly gotten worse over time. Pt has a hx of radiculopathic symptomology down the posterior aspect of her LE's bilaterally, and has been taking gabapentin for about a month now. Pt does not have any numbness or tinging in her LE's at this time, and her LBP has improved with the gabapentin. Pt often experiences cramping in her LE's, which is induced by activity. Pt has scoliosis in her lumbar spine, and previously had discectomy in lumbar spine. Pt values the use of ice as an analgesic modality, but has a hard time icing her back at home. Pt is living with her husband, who is currently undergoing cancer treatments. Pt stated that she would prefer  to develop a detailed HEP in order to eventually come to PT once a week vs twice if possible.   PERTINENT HISTORY:  LBP and radiculopathy have been going on for about three year. Lumbar spine discectomy (unknown levels) Scoliosis of lumbar spine Hx of radiculopathy in sciatic nerve distribution pattern Tenderness to touch in mid thoracic vertebral region, lumbar spine, and L lumbar paraspinals.  Hx of osteoporosis  Hx of osteopenia  One recent LOB, without a fall to the ground  PAIN:  Are you having pain? Yes: NPRS scale:  4-5 at best, 9 at worst/10 Pain location: low back Pain description: sharp in center, describes symptoms as scraping on L side of paraspinals  Aggravating factors: standing on feet for too long. Bad posture, heating pad Relieving factors: naproxen, ice   PRECAUTIONS: None  WEIGHT BEARING RESTRICTIONS No  FALLS:  Has patient fallen in last 6 months? No - one LOB though (caught herself)  LIVING ENVIRONMENT: Lives with: lives with their spouse Lives in: Mobile home Stairs: No Has following equipment at home: Single point cane  OCCUPATION: Retired from a Network engineer position.  PLOF: Independent  PATIENT GOALS : Pt goal to be able to walk 30 mins per day without pain. Overall goal is to decrease pain and increase mobility.   OBJECTIVE:   DIAGNOSTIC FINDINGS:  FINDINGS:  Levoscoliosis of the lumbar spine with apex at L2-L3 and left lateral listhesis of L4 on L5, measuring 8 mm. Loss of thoracic kyphosis and lumbar lordosis. Positive coronal and sagittal imbalance. Grade 1 anterolisthesis of L5 on S1.  Vertebral body heights are preserved. Multilevel degenerative disease throughout the thoracic and lumbar spine, advanced at L2-L3 and suboptimally evaluated due to technique.  Reversal of cervical lordosis. Anterolisthesis of C3 on C4 measures 2.5 mm. Advanced multilevel degenerative disc disease throughout the cervical spine with relative sparing of C2-C3.  Advanced right asymmetric disc height loss at L2-L3.  Enlargement of the cardiac silhouette. Extensive atherosclerotic calcifications.  IMPRESSION:  1. Levoscoliosis of lumbar spine with apex at L2-L3 and left lateral listhesis of L4 on L5. Positive coronal and sagittal imbalance. 2. Grade 1 anterolisthesis of L5 on S1. 3. Multilevel degenerative disc disease, as described above. 4. Enlargement of the cardiac silhouette could represent cardiomegaly or pericardial effusion. Comparison with prior imaging of the chest be helpful for further evaluation. Consider cardiology consult if not previously evaluated.  PATIENT SURVEYS:  FOTO 47/58  SCREENING FOR RED FLAGS: Bowel or bladder incontinence: No Spinal tumors: No Cauda equina syndrome: No Compression fracture: No Abdominal aneurysm: No  COGNITION:  Overall cognitive status: Within functional limits for tasks assessed     SENSATION: Tender to palpation of thoracic and lumbar spine, sensation in tact  MUSCLE LENGTH: Hamstrings: Right WFL deg; Left WFL deg  POSTURE: rounded shoulders, decreased lumbar lordosis, and increased thoracic kyphosis  LUMBAR ROM:   Active  A/PROM  eval  Flexion WFL  Extension WFL (pain limiting)  Right lateral flexion WFL  Left lateral flexion WFL (pain limiting)  Right rotation   Left rotation    (Blank rows = not tested)  LOWER EXTREMITY ROM:     Active  Right eval Left eval  Hip flexion [facility]  Hip extension    Hip abduction    Hip adduction    Hip internal rotation [facility]  Hip external rotation [facility]  Knee flexion [facility]  Knee extension [facility]  Ankle dorsiflexion [facility]  Ankle plantarflexion [facility] WFL  Ankle inversion    Ankle eversion     (Blank rows = not tested)  LOWER EXTREMITY MMT:    MMT Right eval Left eval  Hip flexion 4/5 4/5  Hip extension    Hip abduction    Hip adduction    Hip internal rotation 4/5 4/5  Hip external rotation 4/5 4/5  Knee flexion 4/5  4/5  Knee extension 4+/5 4/5  Ankle dorsiflexion 5/5 5/5  Ankle plantarflexion 5/5 5/5  Ankle inversion    Ankle eversion     (Blank  rows = not tested)  LUMBAR SPECIAL TESTS:  Straight leg raise test: Negative  GAIT: Distance walked: Throughout clinic Assistive device utilized: None Level of assistance: Complete Independence Comments: Pt did not utilize any assistive device, but has SPC at home if her legs feel weak or unstale.    TODAY'S TREATMENT  Evaluation - provided HEP   PATIENT EDUCATION:  Education details: Educated pt on the importance of strengthening her core, postural control, and nerve glides to her LE'S. Person educated: Patient Education method: Explanation, Demonstration, Tactile cues, Verbal cues, and Handouts Education comprehension: verbalized understanding and returned demonstration   HOME EXERCISE PROGRAM: Access Code: [email or web address] URL: [email or web address] Date: 01/23/2022 Prepared by: Dorene Grebe   Exercises - Supine Bridge  - 1 x daily - 7 x weekly - 2 sets - 10 reps - Scapular Retraction with Resistance  - 1 x daily - 7 x weekly - 2 sets - 10 reps - Supine Sciatic Nerve Glide  - 1 x daily - 7 x weekly - 1 sets - 3 reps           ASSESSMENT:  CLINICAL IMPRESSION: Patient is a pleasant 76 y.o. female who was seen today for physical therapy evaluation and treatment for low back pain and radiculopathy. Pt was extremely organized, and had all of her documentation from prior medical appointments available to review. Pt had tenderness to mid thoracic spine during palpation, along with tenderness to L1-L5. Pt also had L lumbar paraspinal pain during palpation, lateral to L3. Pt was educated on the importance of strengthening her core, and looks forward to becoming more active in order to walk for 30 mins per day.    OBJECTIVE IMPAIRMENTS decreased activity tolerance, decreased mobility, difficulty walking, and pain.   ACTIVITY  LIMITATIONS carrying, lifting, and standing  PARTICIPATION LIMITATIONS: community activity  PERSONAL FACTORS Age, Sex, and Time since onset of injury/illness/exacerbation are also affecting patient's functional outcome.   REHAB POTENTIAL: Good  CLINICAL DECISION MAKING: Stable/uncomplicated  EVALUATION COMPLEXITY: Low   GOALS: Goals reviewed with patient? Yes  SHORT TERM GOALS: Target date: 02/20/22  Pt will demonstrate TA activation throughout all HEP interventions, in order to promote and emphasize stability in her core and lower back. Baseline: N/A Goal status: INITIAL   LONG TERM GOALS: Target date: 03/20/22  Pt. Will be able to walk > 30 minutes without increased LBP or radiculopathic symptomology in B LE Baseline: 9/10 pain with prolonged walking Goal status: INITIAL  2.  Pt. Will increase FOTO score to 58 to improve pain-free mobility.  Baseline: 47 Goal status: INITIAL  3.  Pt. will decrease pain score to < 4/10 during all ADLs in order to efficiently help care for her husband without increased pain and discomfort in low back.  Baseline: 4-9 range on NPS Goal status: INITIAL  PLAN: PT FREQUENCY: 1-2x/week  PT DURATION: 8 weeks  PLANNED INTERVENTIONS: Therapeutic exercises, Neuromuscular re-education, Balance training, Gait training, Patient/Family education, Joint mobilization, Cryotherapy, and Manual therapy.  PLAN FOR NEXT SESSION: Advance HEP, pain desensitization techniques to palpation (sensory TENS to paraspinals)?  Cammie Mcgee, PT, DPT # [id number] Thornell Sartorius, SPT 01/24/2022, 10:43 AM

## 2022-01-25 ENCOUNTER — Encounter: Payer: Self-pay | Admitting: Physical Therapy

## 2022-01-25 ENCOUNTER — Ambulatory Visit: Payer: Medicare Other | Admitting: Physical Therapy

## 2022-01-25 DIAGNOSIS — M5416 Radiculopathy, lumbar region: Secondary | ICD-10-CM

## 2022-01-25 DIAGNOSIS — M5459 Other low back pain: Secondary | ICD-10-CM

## 2022-01-25 DIAGNOSIS — M6281 Muscle weakness (generalized): Secondary | ICD-10-CM

## 2022-01-25 DIAGNOSIS — M2560 Stiffness of unspecified joint, not elsewhere classified: Secondary | ICD-10-CM

## 2022-01-25 NOTE — Patient Instructions (Signed)
Access Code: [email or web address] URL: [email or web address] Date: 01/25/2022 Prepared by: Dorene Grebe  Exercises - Supine Bridge  - 1 x daily - 4 x weekly - 2 sets - 10 reps - Alternating Single Leg Bridge  - 1 x daily - 4 x weekly - 2 sets - 10 reps - Marching Bridge  - 1 x daily - 4 x weekly - 2 sets - 10 reps - Leg Swing Single Leg Balance  - 1 x daily - 4 x weekly - 2 sets - 10 reps - Beginner Flat Bicycle  - 1 x daily - 4 x weekly - 2 sets - 10 reps

## 2022-01-25 NOTE — Therapy (Addendum)
OUTPATIENT PHYSICAL THERAPY THORACOLUMBAR TREATMENT   Patient Name: Betty Cunningham MRN: [id number] DOB:[birth year], 76 y.o., female Today's Date: 01/25/2022   PT End of Session - 01/25/22 1302     Visit Number 2    Number of Visits 10    Date for PT Re-Evaluation 03/20/22    PT Start Time 1301    PT Stop Time 1347    PT Time Calculation (min) 46 min    Activity Tolerance Patient tolerated treatment well    Behavior During Therapy [facility] for tasks assessed/performed             History reviewed. No pertinent past medical history. Past Surgical History:  Procedure Laterality Date   ABDOMINAL HYSTERECTOMY     There are no problems to display for this patient.   PCP: Guy Begin, MD  REFERRING PROVIDER: Guy Begin, MD  REFERRING DIAG: Radiculopathy and LBP  Rationale for Evaluation and Treatment Rehabilitation  THERAPY DIAG:  Other low back pain  Radiculopathy, lumbar region  Muscle weakness (generalized)  Joint stiffness  ONSET DATE: 3 yrs ago   SUBJECTIVE:                                                                                                                                                                                           SUBJECTIVE STATEMENT: Pt arrived to the clinic with muscular fatigue, and stated that her body felt deconditioned. Pt had been doing her HEP, and stated that her legs cramped up on her during her bridge interventions. Pt has been busy caring for her husband, and is looking forward to a party that they are attending next week. 0/10 pain, but has muscular tightness in her low back today.   PERTINENT HISTORY:  LBP and radiculopathy have been going on for about three year. Lumbar spine discectomy (unknown levels) Scoliosis of lumbar spine Hx of radiculopathy in sciatic nerve distribution pattern Tenderness to touch in mid thoracic vertebral region, lumbar spine, and L lumbar paraspinals.  Hx of osteoporosis  Hx of  osteopenia  One recent LOB, without a fall to the ground  PAIN:  Are you having pain? Yes: NPRS scale: 0/10 Pain location: low back Pain description: sharp in center, describes symptoms as scraping on L side of paraspinals  Aggravating factors: standing on feet for too long. Bad posture, heating pad Relieving factors: naproxen, ice   PRECAUTIONS: None  WEIGHT BEARING RESTRICTIONS No  FALLS:  Has patient fallen in last 6 months? 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  PLOF: Independent  PATIENT GOALS : Pt goal to be able to walk 30 mins per day without pain. Overall goal is to decrease pain and increase mobility.   OBJECTIVE:   DIAGNOSTIC FINDINGS:  FINDINGS:  Levoscoliosis of the lumbar spine with apex at L2-L3 and left lateral listhesis of L4 on L5, measuring 8 mm. Loss of thoracic kyphosis and lumbar lordosis. Positive coronal and sagittal imbalance. Grade 1 anterolisthesis of L5 on S1.  Vertebral body heights are preserved. Multilevel degenerative disease throughout the thoracic and lumbar spine, advanced at L2-L3 and suboptimally evaluated due to technique.  Reversal of cervical lordosis. Anterolisthesis of C3 on C4 measures 2.5 mm. Advanced multilevel degenerative disc disease throughout the cervical spine with relative sparing of C2-C3. Advanced right asymmetric disc height loss at L2-L3.  Enlargement of the cardiac silhouette. Extensive atherosclerotic calcifications.  IMPRESSION:  1. Levoscoliosis of lumbar spine with apex at L2-L3 and left lateral listhesis of L4 on L5. Positive coronal and sagittal imbalance. 2. Grade 1 anterolisthesis of L5 on S1. 3. Multilevel degenerative disc disease, as described above. 4. Enlargement of the cardiac silhouette could represent  cardiomegaly or pericardial effusion. Comparison with prior imaging of the chest be helpful for further evaluation. Consider cardiology consult if not previously evaluated.  PATIENT SURVEYS:  FOTO 47/58  SCREENING FOR RED FLAGS: Bowel or bladder incontinence: No Spinal tumors: No Cauda equina syndrome: No Compression fracture: No Abdominal aneurysm: No  COGNITION:  Overall cognitive status: Within functional limits for tasks assessed     SENSATION: Tender to palpation of thoracic and lumbar spine, sensation in tact  MUSCLE LENGTH: Hamstrings: Right WFL deg; Left WFL deg  POSTURE: rounded shoulders, decreased lumbar lordosis, and increased thoracic kyphosis  LUMBAR ROM:   Active  A/PROM  eval  Flexion WFL  Extension WFL (pain limiting)  Right lateral flexion WFL  Left lateral flexion WFL (pain limiting)  Right rotation   Left rotation    (Blank rows = not tested)  LOWER EXTREMITY ROM:     Active  Right eval Left eval  Hip flexion [facility]  Hip extension    Hip abduction    Hip adduction    Hip internal rotation [facility]  Hip external rotation [facility]  Knee flexion [facility]  Knee extension [facility]  Ankle dorsiflexion [facility]  Ankle plantarflexion [facility] WFL  Ankle inversion    Ankle eversion     (Blank rows = not tested)  LOWER EXTREMITY MMT:    MMT Right eval Left eval  Hip flexion 4/5 4/5  Hip extension    Hip abduction    Hip adduction    Hip internal rotation 4/5 4/5  Hip external rotation 4/5 4/5  Knee flexion 4/5 4/5  Knee extension 4+/5 4/5  Ankle dorsiflexion 5/5 5/5  Ankle plantarflexion 5/5 5/5  Ankle inversion    Ankle eversion     (Blank rows = not tested)  LUMBAR SPECIAL TESTS:  Straight leg raise test: Negative  GAIT: Distance walked: Throughout clinic Assistive device utilized: None Level of assistance: Complete Independence Comments: Pt did not utilize any assistive device, but has SPC at home if her legs feel weak or unstale.     TODAY'S TREATMENT   01/25/22:  10 mins SciFit level 6 - LE only  Manual Therapy: Prone STM to paraspinals and hamstrings   Prone Hypervolt to B hamstrings - level 1 -  4 mintues  Supine stretching into hip flexion, ER, IR, and knee flexion - 30 s hold at end range - 3 sets per direction B    There ex:   Supine:   TA engagement - 10 x, 5s hold     TA engagement with LE bicycles - 10 x per LE    TA engagement with SLR - 10 x per LE    Bridging 10 x    Bridging with marching 10 x per LE    Bridging with SLR 5 x per LE    Seated on Blue ball:   Seated marching 20 x B     LAQ 20 x B    Seated marching with contralateral punch with 2# weight    Seated LAQ with ipsilateral shoulder abduction with 2# weight  PATIENT EDUCATION:  Education details: Educated pt on the importance of strengthening her core, postural control, and nerve glides to her LE'S. Person educated: Patient Education method: Explanation, Demonstration, Tactile cues, Verbal cues, and Handouts Education comprehension: verbalized understanding and returned demonstration   HOME EXERCISE PROGRAM: Access Code: [email or web address] URL: [email or web address] Date: 01/23/2022 Prepared by: Dorene Grebe   Exercises - Supine Bridge  - 1 x daily - 7 x weekly - 2 sets - 10 reps - Scapular Retraction with Resistance  - 1 x daily - 7 x weekly - 2 sets - 10 reps - Supine Sciatic Nerve Glide  - 1 x daily - 7 x weekly - 1 sets - 3 reps           ASSESSMENT:  CLINICAL IMPRESSION: Pt responded well to TA activation, and was able to speak throughout her TA interventions. Pt experienced cramping throughout her bilateral hamstrings throughout bridging interventions, and was experiencing tenderness on the L side of her knees throughout tx session. Pt hopes to use the treadmill in upcoming tx sessions. Pt also requires close guarding while doing interventions on blue ball. Pt was educated on the importance of  strengthening her core, and looks forward to becoming more active in order to walk for 30 mins per day.    OBJECTIVE IMPAIRMENTS decreased activity tolerance, decreased mobility, difficulty walking, and pain.   ACTIVITY LIMITATIONS carrying, lifting, and standing  PARTICIPATION LIMITATIONS: community activity  PERSONAL FACTORS Age, Sex, and Time since onset of injury/illness/exacerbation are also affecting patient's functional outcome.   REHAB POTENTIAL: Good  CLINICAL DECISION MAKING: Stable/uncomplicated  EVALUATION COMPLEXITY: Low   GOALS: Goals reviewed with patient? Yes  SHORT TERM GOALS: Target date: 02/20/22  Pt will demonstrate TA activation throughout all HEP interventions, in order to promote and emphasize stability in her core and lower back. Baseline: N/A Goal status: INITIAL   LONG TERM GOALS: Target date: 03/20/22  Pt. Will be able to walk > 30 minutes without increased LBP or radiculopathic symptomology in B LE Baseline: 9/10 pain with prolonged walking Goal status: INITIAL  2.  Pt. Will increase FOTO score to 58 to improve pain-free mobility.  Baseline: 47 Goal status: INITIAL  3.  Pt. will decrease pain score to < 4/10 during all ADLs in order to efficiently help care for her husband without increased pain and discomfort in low back.  Baseline: 4-9 range on NPS Goal status: INITIAL  PLAN: PT FREQUENCY: 1-2x/week  PT DURATION: 8 weeks  PLANNED INTERVENTIONS: Therapeutic exercises, Neuromuscular re-education, Balance training, Gait training, Patient/Family education, Joint mobilization, Cryotherapy, and Manual therapy.  PLAN FOR NEXT SESSION: Advance HEP, sensory TENS to paraspinals, strengthening  Cammie Mcgee, PT, DPT # [id number] Thornell Sartorius, SPT 01/25/2022, 5:02 PM

## 2022-02-01 ENCOUNTER — Ambulatory Visit: Payer: Medicare Other | Attending: Physical Medicine & Rehabilitation | Admitting: Physical Therapy

## 2022-02-01 ENCOUNTER — Encounter: Payer: Self-pay | Admitting: Physical Therapy

## 2022-02-01 DIAGNOSIS — M5416 Radiculopathy, lumbar region: Secondary | ICD-10-CM | POA: Insufficient documentation

## 2022-02-01 DIAGNOSIS — M6281 Muscle weakness (generalized): Secondary | ICD-10-CM | POA: Insufficient documentation

## 2022-02-01 DIAGNOSIS — M2560 Stiffness of unspecified joint, not elsewhere classified: Secondary | ICD-10-CM | POA: Insufficient documentation

## 2022-02-01 DIAGNOSIS — M5459 Other low back pain: Secondary | ICD-10-CM | POA: Diagnosis not present

## 2022-02-01 NOTE — Therapy (Addendum)
OUTPATIENT PHYSICAL THERAPY THORACOLUMBAR TREATMENT   Patient Name: Betty Cunningham MRN: [id number] DOB:[birth year], 76 y.o., female Today's Date: 02/01/2022   PT End of Session - 02/01/22 0938     Visit Number 3    Number of Visits 10    Date for PT Re-Evaluation 03/20/22    PT Start Time 0938    PT Stop Time 1019    PT Time Calculation (min) 41 min    Activity Tolerance Patient tolerated treatment well    Behavior During Therapy [facility] for tasks assessed/performed             History reviewed. No pertinent past medical history. Past Surgical History:  Procedure Laterality Date   ABDOMINAL HYSTERECTOMY     There are no problems to display for this patient.   PCP: Guy Begin, MD  REFERRING PROVIDER: Guy Begin, MD  REFERRING DIAG: Radiculopathy and LBP  Rationale for Evaluation and Treatment Rehabilitation  THERAPY DIAG:  Other low back pain  Radiculopathy, lumbar region  Joint stiffness  Muscle weakness (generalized)  ONSET DATE: 3 yrs ago   SUBJECTIVE:                                                                                                                                                                                           SUBJECTIVE STATEMENT: Pt arrived to the clinic feeling energized, but stated that the bridging has been challenging for her at home. Pt had been doing her HEP every other day, and stated that her legs cramped up on her during her bridge interventions. Pt is hoping to walk more regularly, and would prefer to incorporate treadmill into her tx here.  PERTINENT HISTORY:  LBP and radiculopathy have been going on for about three year. Lumbar spine discectomy (unknown levels) Scoliosis of lumbar spine Hx of radiculopathy in sciatic nerve distribution pattern Tenderness to touch in mid thoracic vertebral region, lumbar spine, and L lumbar paraspinals.  Hx of osteoporosis  Hx of osteopenia  One recent LOB, without a fall to  the ground  PAIN:  Are you having pain? Yes: NPRS scale: 0-8/10 Pain location: low back Pain description: sharp in center, describes symptoms as scraping on L side of paraspinals  Aggravating factors: standing on feet for too long. Bad posture, heating pad Relieving factors: naproxen, ice   PRECAUTIONS: None  WEIGHT BEARING RESTRICTIONS No  FALLS:  Has patient fallen in last 6 months? 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  PLOF: Independent  PATIENT GOALS : Pt goal to be able to walk 30 mins per day without pain. Overall goal is to decrease pain and increase mobility.   OBJECTIVE:   DIAGNOSTIC FINDINGS:  FINDINGS:  Levoscoliosis of the lumbar spine with apex at L2-L3 and left lateral listhesis of L4 on L5, measuring 8 mm. Loss of thoracic kyphosis and lumbar lordosis. Positive coronal and sagittal imbalance. Grade 1 anterolisthesis of L5 on S1.  Vertebral body heights are preserved. Multilevel degenerative disease throughout the thoracic and lumbar spine, advanced at L2-L3 and suboptimally evaluated due to technique.  Reversal of cervical lordosis. Anterolisthesis of C3 on C4 measures 2.5 mm. Advanced multilevel degenerative disc disease throughout the cervical spine with relative sparing of C2-C3. Advanced right asymmetric disc height loss at L2-L3.  Enlargement of the cardiac silhouette. Extensive atherosclerotic calcifications.  IMPRESSION:  1. Levoscoliosis of lumbar spine with apex at L2-L3 and left lateral listhesis of L4 on L5. Positive coronal and sagittal imbalance. 2. Grade 1 anterolisthesis of L5 on S1. 3. Multilevel degenerative disc disease, as described above. 4. Enlargement of the cardiac silhouette could represent cardiomegaly or pericardial effusion. Comparison with  prior imaging of the chest be helpful for further evaluation. Consider cardiology consult if not previously evaluated.  PATIENT SURVEYS:  FOTO 47/58  SCREENING FOR RED FLAGS: Bowel or bladder incontinence: No Spinal tumors: No Cauda equina syndrome: No Compression fracture: No Abdominal aneurysm: No  COGNITION:  Overall cognitive status: Within functional limits for tasks assessed     SENSATION: Tender to palpation of thoracic and lumbar spine, sensation in tact  MUSCLE LENGTH: Hamstrings: Right WFL deg; Left WFL deg  POSTURE: rounded shoulders, decreased lumbar lordosis, and increased thoracic kyphosis  LUMBAR ROM:   Active  A/PROM  eval  Flexion WFL  Extension WFL (pain limiting)  Right lateral flexion WFL  Left lateral flexion WFL (pain limiting)  Right rotation   Left rotation    (Blank rows = not tested)  LOWER EXTREMITY ROM:     Active  Right eval Left eval  Hip flexion [facility]  Hip extension    Hip abduction    Hip adduction    Hip internal rotation [facility]  Hip external rotation [facility]  Knee flexion [facility]  Knee extension [facility]  Ankle dorsiflexion [facility]  Ankle plantarflexion [facility] WFL  Ankle inversion    Ankle eversion     (Blank rows = not tested)  LOWER EXTREMITY MMT:    MMT Right eval Left eval  Hip flexion 4/5 4/5  Hip extension    Hip abduction    Hip adduction    Hip internal rotation 4/5 4/5  Hip external rotation 4/5 4/5  Knee flexion 4/5 4/5  Knee extension 4+/5 4/5  Ankle dorsiflexion 5/5 5/5  Ankle plantarflexion 5/5 5/5  Ankle inversion    Ankle eversion     (Blank rows = not tested)  LUMBAR SPECIAL TESTS:  Straight leg raise test: Negative  GAIT: Distance walked: Throughout clinic Assistive device utilized: None Level of assistance: Complete Independence Comments: Pt did not utilize any assistive device, but has SPC at home if her legs feel weak or unstale.    TODAY'S TREATMENT   02/01/22:  10 mins treadmill  - 1 mph at 1 deg incline   Manual Therapy: Prone STM to paraspinals and hamstrings - 4 minutes Prone Hypervolt to B hamstrings, and sacral/lumbar/thoracic paraspinals - level 1 - 8  minutes  There ex: Supine bridging x 10 B Supine bridging x 10 B with SLR  Quadriped with hip extension - 10 x per LE Quadriped with shoulder flexion - 10 x per UE Quadriped bird-dog with contralateral UE and LE involvement - 10 x B Walking lunges in // bars - down and back 6 x total  6" stair step ups - 10 x per LE with single UE assistance on stair railing    01/25/22:  10 mins SciFit level 6 - LE only  Manual Therapy: Prone STM to paraspinals and hamstrings   Prone Hypervolt to B hamstrings - level 1 - 4 mintues  Supine stretching into hip flexion, ER, IR, and knee flexion - 30 s hold at end range - 3 sets per direction B    There ex:   Supine:   TA engagement - 10 x, 5s hold     TA engagement with LE bicycles - 10 x per LE    TA engagement with SLR - 10 x per LE    Bridging 10 x    Bridging with marching 10 x per LE    Bridging with SLR 5 x per LE    Seated on Blue ball:   Seated marching 20 x B     LAQ 20 x B    Seated marching with contralateral punch with 2# weight    Seated LAQ with ipsilateral shoulder abduction with 2# weight  PATIENT EDUCATION:  Education details: Educated pt on the importance of strengthening her core, postural control, and nerve glides to her LE'S. Person educated: Patient Education method: Explanation, Demonstration, Tactile cues, Verbal cues, and Handouts Education comprehension: verbalized understanding and returned demonstration   HOME EXERCISE PROGRAM: Access Code: [email or web address] URL: [email or web address] Date: 01/23/2022 Prepared by: Dorene Grebe   Exercises - Supine Bridge  - 1 x daily - 7 x weekly - 2 sets - 10 reps - Scapular Retraction with Resistance  - 1 x daily - 7 x weekly - 2 sets - 10 reps - Supine Sciatic Nerve Glide   - 1 x daily - 7 x weekly - 1 sets - 3 reps          Access Code: [email or web address] URL: [email or web address] Date: 02/01/2022 Prepared by: Dorene Grebe   Exercises - Supine Bridge  - 1 x daily - 7 x weekly - 2 sets - 10 reps - Scapular Retraction with Resistance  - 1 x daily - 7 x weekly - 2 sets - 10 reps - Supine Sciatic Nerve Glide  - 1 x daily - 7 x weekly - 1 sets - 3 reps - Quadruped Alternating Leg Extensions  - 1 x daily - 7 x weekly - 2 sets - 10 reps - Quadruped Alternating Arm Lift  - 1 x daily - 7 x weekly - 2 sets - 10 reps - Bird Dog  - 1 x daily - 7 x weekly - 2 sets - 10 reps   ASSESSMENT:  CLINICAL IMPRESSION: Pt responded well to bridging exercises, and stated that they are easier on the tx table than they are at home. Pt experienced cramping throughout her bilateral quads and hamstrings throughout bridging interventions, but massaging the area relieved the cramping sensation. Pt stated that the quadriped exercises were more comfortable than supine bridges, and she will work on bird-dogs at home on the floor. Pt was reminded the importance of breathing while engaging her core, and also the importance of strengthening her LE's along with  her transverse abdominis. Pt was fatigued after walking 10 minutes on treadmill, but looks forward to becoming more active in order to walk for 30 mins per day.    OBJECTIVE IMPAIRMENTS decreased activity tolerance, decreased mobility, difficulty walking, and pain.   ACTIVITY LIMITATIONS carrying, lifting, and standing  PARTICIPATION LIMITATIONS: community activity  PERSONAL FACTORS Age, Sex, and Time since onset of injury/illness/exacerbation are also affecting patient's functional outcome.   REHAB POTENTIAL: Good  CLINICAL DECISION MAKING: Stable/uncomplicated  EVALUATION COMPLEXITY: Low   GOALS: Goals reviewed with patient? Yes  SHORT TERM GOALS: Target date: 02/20/22  Pt will demonstrate TA activation  throughout all HEP interventions, in order to promote and emphasize stability in her core and lower back. Baseline: N/A Goal status: INITIAL   LONG TERM GOALS: Target date: 03/20/22  Pt. Will be able to walk > 30 minutes without increased LBP or radiculopathic symptomology in B LE Baseline: 9/10 pain with prolonged walking Goal status: INITIAL  2.  Pt. Will increase FOTO score to 58 to improve pain-free mobility.  Baseline: 47 Goal status: INITIAL  3.  Pt. will decrease pain score to < 4/10 during all ADLs in order to efficiently help care for her husband without increased pain and discomfort in low back.  Baseline: 4-9 range on NPS Goal status: INITIAL  PLAN: PT FREQUENCY: 1-2x/week  PT DURATION: 8 weeks  PLANNED INTERVENTIONS: Therapeutic exercises, Neuromuscular re-education, Balance training, Gait training, Patient/Family education, Joint mobilization, Cryotherapy, and Manual therapy.  PLAN FOR NEXT SESSION: Advance HEP, sensory TENS to paraspinals, strengthening of LE and core.  Cammie Mcgee, PT, DPT # [id number] Thornell Sartorius, SPT 02/01/2022, 1:06 PM

## 2022-02-01 NOTE — Patient Instructions (Signed)
Access Code: [email or web address] URL: [email or web address] Date: 02/01/2022 Prepared by: Dorene Grebe  Exercises - Supine Bridge  - 1 x daily - 7 x weekly - 2 sets - 10 reps - Scapular Retraction with Resistance  - 1 x daily - 7 x weekly - 2 sets - 10 reps - Supine Sciatic Nerve Glide  - 1 x daily - 7 x weekly - 1 sets - 3 reps - Quadruped Alternating Leg Extensions  - 1 x daily - 7 x weekly - 2 sets - 10 reps - Quadruped Alternating Arm Lift  - 1 x daily - 7 x weekly - 2 sets - 10 reps - Bird Dog  - 1 x daily - 7 x weekly - 2 sets - 10 reps

## 2022-02-06 ENCOUNTER — Ambulatory Visit: Payer: Medicare Other

## 2022-02-06 ENCOUNTER — Encounter: Payer: Self-pay | Admitting: Physical Therapy

## 2022-02-06 DIAGNOSIS — M6281 Muscle weakness (generalized): Secondary | ICD-10-CM

## 2022-02-06 DIAGNOSIS — M2560 Stiffness of unspecified joint, not elsewhere classified: Secondary | ICD-10-CM

## 2022-02-06 DIAGNOSIS — M5459 Other low back pain: Secondary | ICD-10-CM

## 2022-02-06 DIAGNOSIS — M5416 Radiculopathy, lumbar region: Secondary | ICD-10-CM

## 2022-02-06 NOTE — Therapy (Signed)
OUTPATIENT PHYSICAL THERAPY THORACOLUMBAR TREATMENT   Patient Name: Betty Cunningham MRN: [id number] DOB:05/03/1946, 76 y.o., female Today's Date: 02/06/2022   PT End of Session - 02/06/22 0947     Visit Number 4    Number of Visits 10    Date for PT Re-Evaluation 03/20/22    PT Start Time 0946    PT Stop Time 1030    PT Time Calculation (min) 44 min    Activity Tolerance Patient tolerated treatment well    Behavior During Therapy [facility] for tasks assessed/performed             History reviewed. No pertinent past medical history. Past Surgical History:  Procedure Laterality Date   ABDOMINAL HYSTERECTOMY     There are no problems to display for this patient.   PCP: Guy Begin, MD  REFERRING PROVIDER: Guy Begin, MD  REFERRING DIAG: Radiculopathy and LBP  Rationale for Evaluation and Treatment Rehabilitation  THERAPY DIAG:  Other low back pain  Radiculopathy, lumbar region  Joint stiffness  Muscle weakness (generalized)  ONSET DATE: 3 yrs ago   SUBJECTIVE:                                                                                                                                                                                           SUBJECTIVE STATEMENT: Pt reports R hip stiffness prior to treatment. No pain in her low back currently. Felt unsteady with HEP exercises from coordination perspective and motor control of core strengthening tasks.  PERTINENT HISTORY:  LBP and radiculopathy have been going on for about three year. Lumbar spine discectomy (unknown levels) Scoliosis of lumbar spine Hx of radiculopathy in sciatic nerve distribution pattern Tenderness to touch in mid thoracic vertebral region, lumbar spine, and L lumbar paraspinals.  Hx of osteoporosis  Hx of osteopenia  One recent LOB, without a fall to the ground  PAIN:  Are you having pain? Yes: NPRS scale: 0/10 Pain location: low back Pain description: sharp in center, describes  symptoms as scraping on L side of paraspinals  Aggravating factors: standing on feet for too long. Bad posture, heating pad Relieving factors: naproxen, ice   PRECAUTIONS: None  WEIGHT BEARING RESTRICTIONS No  FALLS:  Has patient fallen in last 6 months? 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  PLOF: Independent  PATIENT GOALS : Pt goal to be able to walk 30 mins per day without pain. Overall goal is to decrease  pain and increase mobility.   OBJECTIVE:   DIAGNOSTIC FINDINGS:  FINDINGS:  Levoscoliosis of the lumbar spine with apex at L2-L3 and left lateral listhesis of L4 on L5, measuring 8 mm. Loss of thoracic kyphosis and lumbar lordosis. Positive coronal and sagittal imbalance. Grade 1 anterolisthesis of L5 on S1.  Vertebral body heights are preserved. Multilevel degenerative disease throughout the thoracic and lumbar spine, advanced at L2-L3 and suboptimally evaluated due to technique.  Reversal of cervical lordosis. Anterolisthesis of C3 on C4 measures 2.5 mm. Advanced multilevel degenerative disc disease throughout the cervical spine with relative sparing of C2-C3. Advanced right asymmetric disc height loss at L2-L3.  Enlargement of the cardiac silhouette. Extensive atherosclerotic calcifications.  IMPRESSION:  1. Levoscoliosis of lumbar spine with apex at L2-L3 and left lateral listhesis of L4 on L5. Positive coronal and sagittal imbalance. 2. Grade 1 anterolisthesis of L5 on S1. 3. Multilevel degenerative disc disease, as described above. 4. Enlargement of the cardiac silhouette could represent cardiomegaly or pericardial effusion. Comparison with prior imaging of the chest be helpful for further evaluation. Consider cardiology consult if not previously evaluated.  PATIENT SURVEYS:   FOTO 47/58  SCREENING FOR RED FLAGS: Bowel or bladder incontinence: No Spinal tumors: No Cauda equina syndrome: No Compression fracture: No Abdominal aneurysm: No  COGNITION:  Overall cognitive status: Within functional limits for tasks assessed     SENSATION: Tender to palpation of thoracic and lumbar spine, sensation in tact  MUSCLE LENGTH: Hamstrings: Right WFL deg; Left WFL deg  POSTURE: rounded shoulders, decreased lumbar lordosis, and increased thoracic kyphosis  LUMBAR ROM:   Active  A/PROM  eval  Flexion WFL  Extension WFL (pain limiting)  Right lateral flexion WFL  Left lateral flexion WFL (pain limiting)  Right rotation   Left rotation    (Blank rows = not tested)  LOWER EXTREMITY ROM:     Active  Right eval Left eval  Hip flexion [facility]  Hip extension    Hip abduction    Hip adduction    Hip internal rotation [facility]  Hip external rotation [facility]  Knee flexion [facility]  Knee extension [facility]  Ankle dorsiflexion [facility]  Ankle plantarflexion [facility] WFL  Ankle inversion    Ankle eversion     (Blank rows = not tested)  LOWER EXTREMITY MMT:    MMT Right eval Left eval  Hip flexion 4/5 4/5  Hip extension    Hip abduction    Hip adduction    Hip internal rotation 4/5 4/5  Hip external rotation 4/5 4/5  Knee flexion 4/5 4/5  Knee extension 4+/5 4/5  Ankle dorsiflexion 5/5 5/5  Ankle plantarflexion 5/5 5/5  Ankle inversion    Ankle eversion     (Blank rows = not tested)  LUMBAR SPECIAL TESTS:  Straight leg raise test: Negative  GAIT: Distance walked: Throughout clinic Assistive device utilized: None Level of assistance: Complete Independence Comments: Pt did not utilize any assistive device, but has SPC at home if her legs feel weak or unstale.    TODAY'S TREATMENT   02/06/22:  There.ex:  Seated Elliptical for 10 minutes L6 for warm up, LE strengthening and lumbar mobility.   Quadruped bird dogs with hip extension only: Initial  PT demo prior to performance. 2x10/LE  Quadruped bird dogs with UE and contralateral LE: Max TC's on pelvis for neutral pelvis positioning. 2x10. Progressed to yoga block as TC and proprioceptive cue on sacrum to maintain neutral  pelvis positioning.   Mini squats in // bars: max TC's initially on pelvis for hip hinge to improve form/technique and depth for quad and glut activation. 2x20. Second set with light UE support on // bar for increased depth of squat. Excellent carryover with second set.   6" alternating lunges with UE support on stair rails: 5x5 sec holds/LE. Min VC's for form/technique with trail leg   12" alternating running mans (step up with LE and hip flexion to 90 deg on contralateral limb) with UE's on stair rails. X10/LE.   Alternating standing lunges: x8/LE with CGA.    Manual Therapy: 14 minutes total  Prone STM with Hypervolt to B paraspinals along lumbar and thoracic segments and upper gluts for reduction in post exercise soreness- 8 minutes  Supine STM with Hypervolt to bilateral quads with focus on vastus lateralis bilaterally. 3 minutes/LE.    PATIENT EDUCATION:  Education details: form/technique with exercise Person educated: Patient Education method: Explanation, Demonstration, Tactile cues, Verbal cues, and Handouts Education comprehension: verbalized understanding and returned demonstration   HOME EXERCISE PROGRAM: Access Code: [email or web address] URL: [email or web address] Date: 01/23/2022 Prepared by: Dorene Grebe   Exercises - Supine Bridge  - 1 x daily - 7 x weekly - 2 sets - 10 reps - Scapular Retraction with Resistance  - 1 x daily - 7 x weekly - 2 sets - 10 reps - Supine Sciatic Nerve Glide  - 1 x daily - 7 x weekly - 1 sets - 3 reps          Access Code: [email or web address] URL: [email or web address] Date: 02/01/2022 Prepared by: Dorene Grebe   Exercises - Supine Bridge  - 1 x daily - 7 x weekly - 2 sets - 10 reps - Scapular  Retraction with Resistance  - 1 x daily - 7 x weekly - 2 sets - 10 reps - Supine Sciatic Nerve Glide  - 1 x daily - 7 x weekly - 1 sets - 3 reps - Quadruped Alternating Leg Extensions  - 1 x daily - 7 x weekly - 2 sets - 10 reps - Quadruped Alternating Arm Lift  - 1 x daily - 7 x weekly - 2 sets - 10 reps - Bird Dog  - 1 x daily - 7 x weekly - 2 sets - 10 reps   ASSESSMENT:  CLINICAL IMPRESSION: Pt tolerating progression of standing LE strengthening well without exacerbation of pain. Overall requiring minimal multimodal cuing with excellent carryover with form on second sets after cues. Pt highly motivated but does fatigue relatively quickly with upright progression requiring standing rest breaks post exercise. Education provided on proprioceptive techniques for at home exercise for neutral pelvis positioning with pt verbalizing understanding. Pt educated on DOMS and ways to mitigate and what to expect with exercise progression. Pt will continue to benefit from skilled PT services to progress strength and mobility.   OBJECTIVE IMPAIRMENTS decreased activity tolerance, decreased mobility, difficulty walking, and pain.   ACTIVITY LIMITATIONS carrying, lifting, and standing  PARTICIPATION LIMITATIONS: community activity  PERSONAL FACTORS Age, Sex, and Time since onset of injury/illness/exacerbation are also affecting patient's functional outcome.   REHAB POTENTIAL: Good  CLINICAL DECISION MAKING: Stable/uncomplicated  EVALUATION COMPLEXITY: Low   GOALS: Goals reviewed with patient? Yes  SHORT TERM GOALS: Target date: 02/20/22  Pt will demonstrate TA activation throughout all HEP interventions, in order to promote and emphasize stability in her core and lower back. Baseline: N/A Goal status: INITIAL   LONG TERM GOALS: Target date:  03/20/22  Pt. Will be able to walk > 30 minutes without increased LBP or radiculopathic symptomology in B LE Baseline: 9/10 pain with prolonged  walking Goal status: INITIAL  2.  Pt. Will increase FOTO score to 58 to improve pain-free mobility.  Baseline: 47 Goal status: INITIAL  3.  Pt. will decrease pain score to < 4/10 during all ADLs in order to efficiently help care for her husband without increased pain and discomfort in low back.  Baseline: 4-9 range on NPS Goal status: INITIAL  PLAN: PT FREQUENCY: 1-2x/week  PT DURATION: 8 weeks  PLANNED INTERVENTIONS: Therapeutic exercises, Neuromuscular re-education, Balance training, Gait training, Patient/Family education, Joint mobilization, Cryotherapy, and Manual therapy.  PLAN FOR NEXT SESSION: Advance HEP, sensory TENS to paraspinals, strengthening of LE and core.    Delphia Grates. Fairly IV, PT, DPT Physical Therapist- [place]  [facility]     This entire session was performed under direct supervision and direction of a licensed therapist. I have personally read, edited and approve of the note as written.  Thornell Sartorius, SPT  02/06/2022, 10:45 AM

## 2022-02-08 ENCOUNTER — Encounter: Payer: Medicare Other | Admitting: Physical Therapy

## 2022-02-13 ENCOUNTER — Ambulatory Visit: Payer: Medicare Other

## 2022-02-13 DIAGNOSIS — M5459 Other low back pain: Secondary | ICD-10-CM

## 2022-02-13 DIAGNOSIS — M5416 Radiculopathy, lumbar region: Secondary | ICD-10-CM

## 2022-02-13 DIAGNOSIS — M6281 Muscle weakness (generalized): Secondary | ICD-10-CM

## 2022-02-13 DIAGNOSIS — M2560 Stiffness of unspecified joint, not elsewhere classified: Secondary | ICD-10-CM

## 2022-02-13 NOTE — Therapy (Addendum)
OUTPATIENT PHYSICAL THERAPY THORACOLUMBAR TREATMENT   Patient Name: Betty Cunningham MRN: [id number] DOB:[birth year], 76 y.o., female Today's Date: 02/13/2022   PT End of Session - 02/13/22 1221     Visit Number 5    Number of Visits 10    Date for PT Re-Evaluation 03/20/22    PT Start Time 0945    PT Stop Time 1029    PT Time Calculation (min) 44 min    Activity Tolerance Patient tolerated treatment well    Behavior During Therapy [facility] for tasks assessed/performed             History reviewed. No pertinent past medical history. Past Surgical History:  Procedure Laterality Date   ABDOMINAL HYSTERECTOMY     There are no problems to display for this patient.   PCP: Guy Begin, MD  REFERRING PROVIDER: Guy Begin, MD  REFERRING DIAG: Radiculopathy and LBP  Rationale for Evaluation and Treatment Rehabilitation  THERAPY DIAG:  Other low back pain  Radiculopathy, lumbar region  Joint stiffness  Muscle weakness (generalized)  ONSET DATE: 3 yrs ago   SUBJECTIVE:                                                                                                                                                                                           SUBJECTIVE STATEMENT: Pt reports R hip stiffness prior to treatment, which feels like a deep ache, with decreased ROM in R posterior hip. Pt has no pain in her low or mid back currently. Pt has not enjoyed her bridging exercises for HEP, and prefers quadruped positions. Pt hopes to further strengthen LE's, with focus on quadriceps, glutes, and hip flexors.  PERTINENT HISTORY:  LBP and radiculopathy have been going on for about three year. Lumbar spine discectomy (unknown levels) Scoliosis of lumbar spine Hx of radiculopathy in sciatic nerve distribution pattern Tenderness to touch in mid thoracic vertebral region, lumbar spine, and L lumbar paraspinals.  Hx of osteoporosis  Hx of osteopenia  One recent LOB, without a  fall to the ground  PAIN:  Are you having pain? Yes: NPRS scale: 0/10 Pain location: low back Pain description: sharp in center, describes symptoms as scraping on L side of paraspinals  Aggravating factors: standing on feet for too long. Bad posture, heating pad Relieving factors: naproxen, ice   PRECAUTIONS: None  WEIGHT BEARING RESTRICTIONS No  FALLS:  Has patient fallen in last 6 months? 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  PLOF: Independent  PATIENT GOALS : Pt goal to be able to walk 30 mins per day without pain. Overall goal is to decrease pain and increase mobility.   OBJECTIVE:   DIAGNOSTIC FINDINGS:  FINDINGS:  Levoscoliosis of the lumbar spine with apex at L2-L3 and left lateral listhesis of L4 on L5, measuring 8 mm. Loss of thoracic kyphosis and lumbar lordosis. Positive coronal and sagittal imbalance. Grade 1 anterolisthesis of L5 on S1.  Vertebral body heights are preserved. Multilevel degenerative disease throughout the thoracic and lumbar spine, advanced at L2-L3 and suboptimally evaluated due to technique.  Reversal of cervical lordosis. Anterolisthesis of C3 on C4 measures 2.5 mm. Advanced multilevel degenerative disc disease throughout the cervical spine with relative sparing of C2-C3. Advanced right asymmetric disc height loss at L2-L3.  Enlargement of the cardiac silhouette. Extensive atherosclerotic calcifications.  IMPRESSION:  1. Levoscoliosis of lumbar spine with apex at L2-L3 and left lateral listhesis of L4 on L5. Positive coronal and sagittal imbalance. 2. Grade 1 anterolisthesis of L5 on S1. 3. Multilevel degenerative disc disease, as described above. 4. Enlargement of the cardiac silhouette could represent cardiomegaly or pericardial effusion. Comparison  with prior imaging of the chest be helpful for further evaluation. Consider cardiology consult if not previously evaluated.  PATIENT SURVEYS:  FOTO 47/58  SCREENING FOR RED FLAGS: Bowel or bladder incontinence: No Spinal tumors: No Cauda equina syndrome: No Compression fracture: No Abdominal aneurysm: No  COGNITION:  Overall cognitive status: Within functional limits for tasks assessed     SENSATION: Tender to palpation of thoracic and lumbar spine, sensation in tact  MUSCLE LENGTH: Hamstrings: Right WFL deg; Left WFL deg  POSTURE: rounded shoulders, decreased lumbar lordosis, and increased thoracic kyphosis  LUMBAR ROM:   Active  A/PROM  eval  Flexion WFL  Extension WFL (pain limiting)  Right lateral flexion WFL  Left lateral flexion WFL (pain limiting)  Right rotation   Left rotation    (Blank rows = not tested)  LOWER EXTREMITY ROM:     Active  Right eval Left eval  Hip flexion [facility]  Hip extension    Hip abduction    Hip adduction    Hip internal rotation [facility]  Hip external rotation [facility]  Knee flexion [facility]  Knee extension [facility]  Ankle dorsiflexion [facility]  Ankle plantarflexion [facility] WFL  Ankle inversion    Ankle eversion     (Blank rows = not tested)  LOWER EXTREMITY MMT:    MMT Right eval Left eval  Hip flexion 4/5 4/5  Hip extension    Hip abduction    Hip adduction    Hip internal rotation 4/5 4/5  Hip external rotation 4/5 4/5  Knee flexion 4/5 4/5  Knee extension 4+/5 4/5  Ankle dorsiflexion 5/5 5/5  Ankle plantarflexion 5/5 5/5  Ankle inversion    Ankle eversion     (Blank rows = not tested)  LUMBAR SPECIAL TESTS:  Straight leg raise test: Negative  GAIT: Distance walked: Throughout clinic Assistive device utilized: None Level of assistance: Complete Independence Comments: Pt did not utilize any assistive device, but has SPC at home if her legs feel weak or unstale.    TODAY'S TREATMENT   02/13/22:  There  ex:  Seated Elliptical for 10 mins L6 with UE and LE involvement  12" alternating lunges with UE support on stair rails: 5x5 sec holds/LE. Min VC's for form/technique with trail leg  12" stair step up forward and lateral on B sides - 10 x per direction, step up with LE and hip flexion to 90 deg on contralateral limb - VC to maintain straight leg once pt ascended the 12" step on stationary limb   Standing lunges in // bars 10 x per LE, with posterior knee tapping the ground with single UE support    SLR in supine position with 2# ankle weight on each LE - VC on TA engagement   3-way B hip strengthening with green TB around ankles in // bars - 5x hip flexion/ abduction/extension, which was added to HEP   Manual Therapy:  LAD to B LE with 30 s hold - grade 2/3 with hand support around ankle   Pulsed hip distraction with hip flexed to 90 deg in supine position - 30 x per LE - grade 3   Supine stretching into hip flexion / ER / IR / hamstring B - 30 s hold with overpressure at the range     PATIENT EDUCATION:  Education details: form/technique with exercise Person educated: Patient Education method: Explanation, Demonstration, Tactile cues, Verbal cues, and Handouts Education comprehension: verbalized understanding and returned demonstration   HOME EXERCISE PROGRAM: Access Code: [email or web address] URL: [email or web address] Date: 01/23/2022 Prepared by: Dorene Grebe   Exercises - Supine Bridge  - 1 x daily - 7 x weekly - 2 sets - 10 reps - Scapular Retraction with Resistance  - 1 x daily - 7 x weekly - 2 sets - 10 reps - Supine Sciatic Nerve Glide  - 1 x daily - 7 x weekly - 1 sets - 3 reps        3-WAY hip exercises with green TB - hip flexion/ext/abduction 10 x per LE - 5x per week (not in med bridge)  Access Code: [email or web address] URL: [email or web address] Date: 02/01/2022 Prepared by: Dorene Grebe   Exercises - Supine Bridge  - 1 x daily - 7 x weekly - 2  sets - 10 reps - Scapular Retraction with Resistance  - 1 x daily - 7 x weekly - 2 sets - 10 reps - Supine Sciatic Nerve Glide  - 1 x daily - 7 x weekly - 1 sets - 3 reps - Quadruped Alternating Leg Extensions  - 1 x daily - 7 x weekly - 2 sets - 10 reps - Quadruped Alternating Arm Lift  - 1 x daily - 7 x weekly - 2 sets - 10 reps - Bird Dog  - 1 x daily - 7 x weekly - 2 sets - 10 reps   ASSESSMENT:  CLINICAL IMPRESSION: Pt tolerated tx well, without increased discomfort in R posterior hip upon interventions within the clinic. Pt experienced decreased strength in her hip flexors upon intervention, and was challenged with Supine SLR with 2# weight. Pt required verbal cueing to engage her TA throughout tx, and exhibited weakness in B abductors during green TB hip exercise within the // bars. Pt's endurance on elliptical was improved from prior sessions, and will benefit from walking further on the treadmill next tx. Pt benefited from tactile cueing to her quadriceps during 12" step ups, in order to promote muscular engagement and straighening of the LE.    OBJECTIVE IMPAIRMENTS decreased activity tolerance, decreased mobility, difficulty walking, and pain.   ACTIVITY LIMITATIONS carrying, lifting, and standing  PARTICIPATION LIMITATIONS: community activity  PERSONAL FACTORS Age, Sex, and Time since onset of injury/illness/exacerbation are also affecting patient's functional outcome.   REHAB POTENTIAL: Good  CLINICAL DECISION MAKING: Stable/uncomplicated  EVALUATION COMPLEXITY: Low   GOALS: Goals reviewed with patient? Yes  SHORT TERM GOALS: Target date: 02/20/22  Pt will demonstrate TA activation throughout all HEP interventions, in order to promote and emphasize stability in her core and lower back. Baseline: N/A Goal status: INITIAL   LONG TERM GOALS: Target date: 03/20/22  Pt. Will be able to walk > 30 minutes without increased LBP or radiculopathic symptomology in B  LE Baseline: 9/10 pain with prolonged walking Goal status: INITIAL  2.  Pt. Will increase FOTO score to 58 to improve pain-free mobility.  Baseline: 47 Goal status: INITIAL  3.  Pt. will decrease pain score to < 4/10 during all ADLs in order to efficiently help care for her husband without increased pain and discomfort in low back.  Baseline: 4-9 range on NPS Goal status: INITIAL  PLAN: PT FREQUENCY: 1-2x/week  PT DURATION: 8 weeks  PLANNED INTERVENTIONS: Therapeutic exercises, Neuromuscular re-education, Balance training, Gait training, Patient/Family education, Joint mobilization, Cryotherapy, and Manual therapy.  PLAN FOR NEXT SESSION: Advance HEP, WALKING ON TREADMILL (30 MINS *goal*), strengthening of LE and core. Discuss followup plan with pain clinic in [facility]    Delphia Grates. Fairly IV, PT, DPT Physical Therapist- [facility]  [facility]   [place], [place] 02/13/2022, 2:39 PM

## 2022-02-20 ENCOUNTER — Encounter: Payer: Self-pay | Admitting: Physical Therapy

## 2022-02-20 ENCOUNTER — Ambulatory Visit: Payer: Medicare Other | Admitting: Physical Therapy

## 2022-02-20 DIAGNOSIS — M6281 Muscle weakness (generalized): Secondary | ICD-10-CM

## 2022-02-20 DIAGNOSIS — M5459 Other low back pain: Secondary | ICD-10-CM

## 2022-02-20 DIAGNOSIS — M5416 Radiculopathy, lumbar region: Secondary | ICD-10-CM

## 2022-02-20 DIAGNOSIS — M2560 Stiffness of unspecified joint, not elsewhere classified: Secondary | ICD-10-CM

## 2022-02-20 NOTE — Therapy (Signed)
OUTPATIENT PHYSICAL THERAPY THORACOLUMBAR TREATMENT   Patient Name: Betty Cunningham MRN: [id number] DOB:[birth year], 76 y.o., female Today's Date: 02/20/2022   PT End of Session - 02/20/22 0941     Visit Number 6    Number of Visits 10    Date for PT Re-Evaluation 03/20/22    PT Start Time 0941    Activity Tolerance Patient tolerated treatment well    Behavior During Therapy [facility] for tasks assessed/performed             History reviewed. No pertinent past medical history. Past Surgical History:  Procedure Laterality Date   ABDOMINAL HYSTERECTOMY     There are no problems to display for this patient.   PCP: Christoper Fabian, MD  REFERRING PROVIDER: Christoper Fabian, MD  REFERRING DIAG: Radiculopathy and LBP  Rationale for Evaluation and Treatment Rehabilitation  THERAPY DIAG:  Other low back pain  Radiculopathy, lumbar region  Joint stiffness  Muscle weakness (generalized)  ONSET DATE: 3 yrs ago   SUBJECTIVE:                                                                                                                                                                                           SUBJECTIVE STATEMENT:  Pt. Reports no pain prior to PT tx. Session. Pt hopes to further strengthen LE's, with focus on quadriceps, glutes, and hip flexors.  Pt. Planning to cancel appt. With pain clinic secondary to not having pain.    PERTINENT HISTORY:  LBP and radiculopathy have been going on for about three year. Lumbar spine discectomy (unknown levels) Scoliosis of lumbar spine Hx of radiculopathy in sciatic nerve distribution pattern Tenderness to touch in mid thoracic vertebral region, lumbar spine, and L lumbar paraspinals.  Hx of osteoporosis  Hx of osteopenia  One recent LOB, without a fall to the ground  PAIN:  Are you having pain? Yes: NPRS scale: 0/10 Pain location: low back Pain description: sharp in center, describes symptoms as scraping on L side of  paraspinals  Aggravating factors: standing on feet for too long. Bad posture, heating pad Relieving factors: naproxen, ice   PRECAUTIONS: None  WEIGHT BEARING RESTRICTIONS No  FALLS:  Has patient fallen in last 6 months? No - one LOB though (caught herself)  LIVING ENVIRONMENT: Lives with: lives with their spouse Lives in: Mobile home Stairs: No Has following equipment at home: Single point cane  OCCUPATION: Retired from a Network engineer position.  PLOF: Independent  PATIENT GOALS : Pt goal to be able to walk 30 mins per day without pain. Overall goal is to decrease pain and increase mobility.  OBJECTIVE:   DIAGNOSTIC FINDINGS:  FINDINGS:  Levoscoliosis of the lumbar spine with apex at L2-L3 and left lateral listhesis of L4 on L5, measuring 8 mm. Loss of thoracic kyphosis and lumbar lordosis. Positive coronal and sagittal imbalance. Grade 1 anterolisthesis of L5 on S1.  Vertebral body heights are preserved. Multilevel degenerative disease throughout the thoracic and lumbar spine, advanced at L2-L3 and suboptimally evaluated due to technique.  Reversal of cervical lordosis. Anterolisthesis of C3 on C4 measures 2.5 mm. Advanced multilevel degenerative disc disease throughout the cervical spine with relative sparing of C2-C3. Advanced right asymmetric disc height loss at L2-L3.  Enlargement of the cardiac silhouette. Extensive atherosclerotic calcifications.  IMPRESSION:  1. Levoscoliosis of lumbar spine with apex at L2-L3 and left lateral listhesis of L4 on L5. Positive coronal and sagittal imbalance. 2. Grade 1 anterolisthesis of L5 on S1. 3. Multilevel degenerative disc disease, as described above. 4. Enlargement of the cardiac silhouette could represent cardiomegaly or pericardial effusion. Comparison with prior imaging of the chest be helpful for further evaluation. Consider cardiology consult if not previously evaluated.  PATIENT SURVEYS:  FOTO 47/58  SCREENING FOR RED  FLAGS: Bowel or bladder incontinence: No Spinal tumors: No Cauda equina syndrome: No Compression fracture: No Abdominal aneurysm: No  COGNITION:  Overall cognitive status: Within functional limits for tasks assessed     SENSATION: Tender to palpation of thoracic and lumbar spine, sensation in tact  MUSCLE LENGTH: Hamstrings: Right WFL deg; Left WFL deg  POSTURE: rounded shoulders, decreased lumbar lordosis, and increased thoracic kyphosis  LUMBAR ROM:   Active  A/PROM  eval  Flexion WFL  Extension WFL (pain limiting)  Right lateral flexion WFL  Left lateral flexion WFL (pain limiting)  Right rotation   Left rotation    (Blank rows = not tested)  LOWER EXTREMITY ROM:     Active  Right eval Left eval  Hip flexion [facility]  Hip extension    Hip abduction    Hip adduction    Hip internal rotation [facility]  Hip external rotation [facility]  Knee flexion [facility]  Knee extension [facility]  Ankle dorsiflexion [facility]  Ankle plantarflexion [facility] WFL  Ankle inversion    Ankle eversion     (Blank rows = not tested)  LOWER EXTREMITY MMT:    MMT Right eval Left eval  Hip flexion 4/5 4/5  Hip extension    Hip abduction    Hip adduction    Hip internal rotation 4/5 4/5  Hip external rotation 4/5 4/5  Knee flexion 4/5 4/5  Knee extension 4+/5 4/5  Ankle dorsiflexion 5/5 5/5  Ankle plantarflexion 5/5 5/5  Ankle inversion    Ankle eversion     (Blank rows = not tested)  LUMBAR SPECIAL TESTS:  Straight leg raise test: Negative  GAIT: Distance walked: Throughout clinic Assistive device utilized: None Level of assistance: Complete Independence Comments: Pt did not utilize any assistive device, but has SPC at home if her legs feel weak or unstale.    TODAY'S TREATMENT   02/20/22:  There.ex.:  TM walking 15 min. At 1.5 mph with light UE assist.  Good endurance.  Walking in PT clinic after TM.  Pt. Discussed heel strike/ step pattern.    Supine LE/ lumbar rotn.  Stretches (9 min.).  L anterior hip pinching with knee to chest.    STM to thoracic/lumbar spine in sitting.    Neuro:  Discussed posture/ scoliosis and back support  Resisted gait 2BTB f/b with minimal UE assist required.  Focus on LE strength/ balance.      02/13/22:  There ex:  Seated Elliptical for 10 mins L6 with UE and LE involvement  12" alternating lunges with UE support on stair rails: 5x5 sec holds/LE. Min VC's for form/technique with trail leg   12" stair step up forward and lateral on B sides - 10 x per direction, step up with LE and hip flexion to 90 deg on contralateral limb - VC to maintain straight leg once pt ascended the 12" step on stationary limb   Standing lunges in // bars 10 x per LE, with posterior knee tapping the ground with single UE support    SLR in supine position with 2# ankle weight on each LE - VC on TA engagement   3-way B hip strengthening with green TB around ankles in // bars - 5x hip flexion/ abduction/extension, which was added to HEP   Manual Therapy:  LAD to B LE with 30 s hold - grade 2/3 with hand support around ankle   Pulsed hip distraction with hip flexed to 90 deg in supine position - 30 x per LE - grade 3   Supine stretching into hip flexion / ER / IR / hamstring B - 30 s hold with overpressure at the range     PATIENT EDUCATION:  Education details: form/technique with exercise Person educated: Patient Education method: Explanation, Demonstration, Tactile cues, Verbal cues, and Handouts Education comprehension: verbalized understanding and returned demonstration   HOME EXERCISE PROGRAM: Access Code: [email or web address] URL: [email or web address] Date: 01/23/2022 Prepared by: Dorcas Carrow   Exercises - Supine Bridge  - 1 x daily - 7 x weekly - 2 sets - 10 reps - Scapular Retraction with Resistance  - 1 x daily - 7 x weekly - 2 sets - 10 reps - Supine Sciatic Nerve Glide  - 1 x daily - 7 x weekly - 1 sets - 3 reps         3-WAY hip exercises with green TB - hip flexion/ext/abduction 10 x per LE - 5x per week (not in med bridge)  Access Code: [email or web address] URL: [email or web address] Date: 02/01/2022 Prepared by: Dorcas Carrow   Exercises - Supine Bridge  - 1 x daily - 7 x weekly - 2 sets - 10 reps - Scapular Retraction with Resistance  - 1 x daily - 7 x weekly - 2 sets - 10 reps - Supine Sciatic Nerve Glide  - 1 x daily - 7 x weekly - 1 sets - 3 reps - Quadruped Alternating Leg Extensions  - 1 x daily - 7 x weekly - 2 sets - 10 reps - Quadruped Alternating Arm Lift  - 1 x daily - 7 x weekly - 2 sets - 10 reps - Bird Dog  - 1 x daily - 7 x weekly - 2 sets - 10 reps   ASSESSMENT:  CLINICAL IMPRESSION: Pt tolerated tx well, without increased discomfort in R posterior hip upon interventions within the clinic. Pt did report L anterior hip discomfort during knee to chest stretches in supine position.  Good walking endurance on TM for 15 minutes at a consistent cadence.  Good understanding of posture correction and current HEP.  Pt. Encouraged to walk more at home to improve endurance.  Pt. Will continue to benefit from skilled PT services to increase LE strength/ walking endurance.     OBJECTIVE IMPAIRMENTS decreased activity tolerance, decreased mobility, difficulty walking, and  pain.   ACTIVITY LIMITATIONS carrying, lifting, and standing  PARTICIPATION LIMITATIONS: community activity  PERSONAL FACTORS Age, Sex, and Time since onset of injury/illness/exacerbation are also affecting patient's functional outcome.   REHAB POTENTIAL: Good  CLINICAL DECISION MAKING: Stable/uncomplicated  EVALUATION COMPLEXITY: Low   GOALS: Goals reviewed with patient? Yes  SHORT TERM GOALS: Target date: 02/20/22  Pt will demonstrate TA activation throughout all HEP interventions, in order to promote and emphasize stability in her core and lower back. Baseline: N/A Goal status: Partially met   LONG  TERM GOALS: Target date: 03/20/22  Pt. Will be able to walk > 30 minutes without increased LBP or radiculopathic symptomology in B LE Baseline: 9/10 pain with prolonged walking Goal status: INITIAL  2.  Pt. Will increase FOTO score to 58 to improve pain-free mobility.  Baseline: 47 Goal status: INITIAL  3.  Pt. will decrease pain score to < 4/10 during all ADLs in order to efficiently help care for her husband without increased pain and discomfort in low back.  Baseline: 4-9 range on NPS Goal status: INITIAL  PLAN: PT FREQUENCY: 1-2x/week  PT DURATION: 8 weeks  PLANNED INTERVENTIONS: Therapeutic exercises, Neuromuscular re-education, Balance training, Gait training, Patient/Family education, Joint mobilization, Cryotherapy, and Manual therapy.  PLAN FOR NEXT SESSION: Advance HEP, WALKING ON TREADMILL (30 MINS *goal*), strengthening of LE and core.    Pura Spice, PT, DPT # [phone]  02/20/2022, 9:42 AM

## 2022-02-22 ENCOUNTER — Encounter: Payer: Medicare Other | Admitting: Physical Therapy

## 2022-02-27 ENCOUNTER — Encounter: Payer: Self-pay | Admitting: Physical Therapy

## 2022-02-27 ENCOUNTER — Ambulatory Visit: Payer: Medicare Other | Attending: Family Medicine | Admitting: Physical Therapy

## 2022-02-27 DIAGNOSIS — M5459 Other low back pain: Secondary | ICD-10-CM | POA: Insufficient documentation

## 2022-02-27 DIAGNOSIS — M2560 Stiffness of unspecified joint, not elsewhere classified: Secondary | ICD-10-CM | POA: Diagnosis present

## 2022-02-27 DIAGNOSIS — M5416 Radiculopathy, lumbar region: Secondary | ICD-10-CM | POA: Insufficient documentation

## 2022-02-27 NOTE — Therapy (Signed)
OUTPATIENT PHYSICAL THERAPY THORACOLUMBAR TREATMENT/ DISCHARGE   Patient Name: Betty Cunningham MRN: [id number] DOB:1946/06/06, 76 y.o., female Today's Date: 02/27/2022   PT End of Session - 02/27/22 0934     Visit Number 7    Number of Visits 10    Date for PT Re-Evaluation 03/20/22    PT Start Time 0928    PT Stop Time 1012    PT Time Calculation (min) 44 min    Activity Tolerance Patient tolerated treatment well    Behavior During Therapy [facility] for tasks assessed/performed             History reviewed. No pertinent past medical history. Past Surgical History:  Procedure Laterality Date   ABDOMINAL HYSTERECTOMY     There are no problems to display for this patient.   PCP: Christoper Fabian, MD  REFERRING PROVIDER: Christoper Fabian, MD  REFERRING DIAG: Radiculopathy and LBP  Rationale for Evaluation and Treatment Rehabilitation  THERAPY DIAG:  Other low back pain  Radiculopathy, lumbar region  Joint stiffness  ONSET DATE: 3 yrs ago   SUBJECTIVE:                                                                                                                                                                                           SUBJECTIVE STATEMENT:  Pt. Reports no pain prior to PT tx. Session.   Pt. Still has pain clinic appt. In a few weeks.    PERTINENT HISTORY:  LBP and radiculopathy have been going on for about three year. Lumbar spine discectomy (unknown levels) Scoliosis of lumbar spine Hx of radiculopathy in sciatic nerve distribution pattern Tenderness to touch in mid thoracic vertebral region, lumbar spine, and L lumbar paraspinals.  Hx of osteoporosis  Hx of osteopenia  One recent LOB, without a fall to the ground  PAIN:  Are you having pain? Yes: NPRS scale: 0/10 Pain location: low back Pain description: sharp in center, describes symptoms as scraping on L side of paraspinals  Aggravating factors: standing on feet for too long. Bad posture,  heating pad Relieving factors: naproxen, ice   PRECAUTIONS: None  WEIGHT BEARING RESTRICTIONS No  FALLS:  Has patient fallen in last 6 months? No - one LOB though (caught herself)  LIVING ENVIRONMENT: Lives with: lives with their spouse Lives in: Mobile home Stairs: No Has following equipment at home: Single point cane  OCCUPATION: Retired from a Network engineer position.  PLOF: Independent  PATIENT GOALS : Pt goal to be able to walk 30 mins per day without pain. Overall goal is to decrease pain and increase mobility.   OBJECTIVE:  DIAGNOSTIC FINDINGS:  FINDINGS:  Levoscoliosis of the lumbar spine with apex at L2-L3 and left lateral listhesis of L4 on L5, measuring 8 mm. Loss of thoracic kyphosis and lumbar lordosis. Positive coronal and sagittal imbalance. Grade 1 anterolisthesis of L5 on S1.  Vertebral body heights are preserved. Multilevel degenerative disease throughout the thoracic and lumbar spine, advanced at L2-L3 and suboptimally evaluated due to technique.  Reversal of cervical lordosis. Anterolisthesis of C3 on C4 measures 2.5 mm. Advanced multilevel degenerative disc disease throughout the cervical spine with relative sparing of C2-C3. Advanced right asymmetric disc height loss at L2-L3.  Enlargement of the cardiac silhouette. Extensive atherosclerotic calcifications.  IMPRESSION:  1. Levoscoliosis of lumbar spine with apex at L2-L3 and left lateral listhesis of L4 on L5. Positive coronal and sagittal imbalance. 2. Grade 1 anterolisthesis of L5 on S1. 3. Multilevel degenerative disc disease, as described above. 4. Enlargement of the cardiac silhouette could represent cardiomegaly or pericardial effusion. Comparison with prior imaging of the chest be helpful for further evaluation. Consider cardiology consult if not previously evaluated.  PATIENT SURVEYS:  FOTO 47/58  SCREENING FOR RED FLAGS: Bowel or bladder incontinence: No Spinal tumors: No Cauda equina  syndrome: No Compression fracture: No Abdominal aneurysm: No  COGNITION:  Overall cognitive status: Within functional limits for tasks assessed     SENSATION: Tender to palpation of thoracic and lumbar spine, sensation in tact  MUSCLE LENGTH: Hamstrings: Right WFL deg; Left WFL deg  POSTURE: rounded shoulders, decreased lumbar lordosis, and increased thoracic kyphosis  LUMBAR ROM:   Active  A/PROM  eval  Flexion WFL  Extension WFL (pain limiting)  Right lateral flexion WFL  Left lateral flexion WFL (pain limiting)  Right rotation   Left rotation    (Blank rows = not tested)  LOWER EXTREMITY ROM:     Active  Right eval Left eval  Hip flexion [facility]  Hip extension    Hip abduction    Hip adduction    Hip internal rotation [facility]  Hip external rotation [facility]  Knee flexion [facility]  Knee extension [facility]  Ankle dorsiflexion [facility]  Ankle plantarflexion [facility] WFL  Ankle inversion    Ankle eversion     (Blank rows = not tested)  LOWER EXTREMITY MMT:    MMT Right eval Left eval  Hip flexion 4/5 4/5  Hip extension    Hip abduction    Hip adduction    Hip internal rotation 4/5 4/5  Hip external rotation 4/5 4/5  Knee flexion 4/5 4/5  Knee extension 4+/5 4/5  Ankle dorsiflexion 5/5 5/5  Ankle plantarflexion 5/5 5/5  Ankle inversion    Ankle eversion     (Blank rows = not tested)  LUMBAR SPECIAL TESTS:  Straight leg raise test: Negative  GAIT: Distance walked: Throughout clinic Assistive device utilized: None Level of assistance: Complete Independence Comments: Pt did not utilize any assistive device, but has SPC at home if her legs feel weak or unstale.    TODAY'S TREATMENT   02/27/22:  There.ex.:  TM walking 20 min. At 1.6 mph with light UE assist.  Good endurance.  Supine TrA ex.: ball knee to chest/ rotn./ SLR/ bridging (short holds) 20x each.      Goal reassessment/ discussed HEP  Manual tx.:  Supine LE/ lumbar rotn. Stretches ( 8  min.).    STM to thoracic/lumbar spine in sitting.  Use of Hypervolt   02/20/22:  There.ex.:  TM walking  15 min. At 1.5 mph with light UE assist.  Good endurance.  Walking in PT clinic after TM.  Pt. Discussed heel strike/ step pattern.    Supine LE/ lumbar rotn. Stretches (9 min.).  L anterior hip pinching with knee to chest.    STM to thoracic/lumbar spine in sitting.    Neuro:  Discussed posture/ scoliosis and back support  Resisted gait 2BTB f/b with minimal UE assist required.  Focus on LE strength/ balance.     PATIENT EDUCATION:  Education details: form/technique with exercise Person educated: Patient Education method: Explanation, Demonstration, Tactile cues, Verbal cues, and Handouts Education comprehension: verbalized understanding and returned demonstration   HOME EXERCISE PROGRAM: Access Code: [email or web address] URL: [email or web address] Date: 01/23/2022 Prepared by: Dorcas Carrow   Exercises - Supine Bridge  - 1 x daily - 7 x weekly - 2 sets - 10 reps - Scapular Retraction with Resistance  - 1 x daily - 7 x weekly - 2 sets - 10 reps - Supine Sciatic Nerve Glide  - 1 x daily - 7 x weekly - 1 sets - 3 reps        3-WAY hip exercises with green TB - hip flexion/ext/abduction 10 x per LE - 5x per week (not in med bridge)  Access Code: [email or web address] URL: [email or web address] Date: 02/01/2022 Prepared by: Dorcas Carrow   Exercises - Supine Bridge  - 1 x daily - 7 x weekly - 2 sets - 10 reps - Scapular Retraction with Resistance  - 1 x daily - 7 x weekly - 2 sets - 10 reps - Supine Sciatic Nerve Glide  - 1 x daily - 7 x weekly - 1 sets - 3 reps - Quadruped Alternating Leg Extensions  - 1 x daily - 7 x weekly - 2 sets - 10 reps - Quadruped Alternating Arm Lift  - 1 x daily - 7 x weekly - 2 sets - 10 reps - Bird Dog  - 1 x daily - 7 x weekly - 2 sets - 10 reps   ASSESSMENT:  CLINICAL IMPRESSION: Pt. Has progressed well towards all PT  goals.  No c/o pain during walking/ supine TrA ex.  Good understanding of importance of HEP.  Good walking endurance on TM for 20 minutes at a consistent cadence.  Pt. Encouraged to walk more at home to improve endurance.  Discharge from PT at this time with focus on daily walking/ HEP.  Pt. Instructed to contact PT if any questions or regression in symptoms.      OBJECTIVE IMPAIRMENTS decreased activity tolerance, decreased mobility, difficulty walking, and pain.   ACTIVITY LIMITATIONS carrying, lifting, and standing  PARTICIPATION LIMITATIONS: community activity  PERSONAL FACTORS Age, Sex, and Time since onset of injury/illness/exacerbation are also affecting patient's functional outcome.   REHAB POTENTIAL: Good  CLINICAL DECISION MAKING: Stable/uncomplicated  EVALUATION COMPLEXITY: Low   GOALS: Goals reviewed with patient? Yes  SHORT TERM GOALS: Target date: 02/20/22  Pt will demonstrate TA activation throughout all HEP interventions, in order to promote and emphasize stability in her core and lower back. Baseline: N/A.  8/1: good TrA activation during supine ex.  Goal status: Goal met   LONG TERM GOALS: Target date: 03/20/22  Pt. Will be able to walk > 30 minutes without increased LBP or radiculopathic symptomology in B LE Baseline: 9/10 pain with prolonged walking. 8/1: no pain reported.  Goal status: Goal met  2.  Pt. Will increase FOTO score to 58 to improve pain-free mobility.  Baseline: 47.  8/1: 79 Goal status: Goal met  3.  Pt. will decrease pain score to < 4/10 during all ADLs in order to efficiently help care for her husband without increased pain and discomfort in low back.  Baseline: 4-9 range on NPS.  8/1: no pain reported.  Goal status: Goal met  PLAN: PT FREQUENCY: 1-2x/week  PT DURATION: 8 weeks  PLANNED INTERVENTIONS: Therapeutic exercises, Neuromuscular re-education, Balance training, Gait training, Patient/Family education, Joint mobilization,  Cryotherapy, and Manual therapy.  PLAN FOR NEXT SESSION: Discharge visit   Pura Spice, PT, DPT # [phone] 02/27/2022, 10:20 AM

## 2022-03-01 ENCOUNTER — Encounter: Payer: Medicare Other | Admitting: Physical Therapy

## 2022-03-08 ENCOUNTER — Encounter: Payer: Medicare Other | Admitting: Physical Therapy

## 2022-10-30 ENCOUNTER — Ambulatory Visit: Admission: RE | Admit: 2022-10-30 | Payer: Medicare HMO | Source: Ambulatory Visit
